# Patient Record
Sex: Female | Born: 1980 | Race: White | Hispanic: No | Smoking: Current every day smoker
Health system: Southern US, Community
[De-identification: ages and names within clinical notes are randomized; demographics above are authoritative.]

## PROBLEM LIST (undated history)

## (undated) DIAGNOSIS — F329 Major depressive disorder, single episode, unspecified: Secondary | ICD-10-CM

## (undated) DIAGNOSIS — F4310 Post-traumatic stress disorder, unspecified: Secondary | ICD-10-CM

## (undated) DIAGNOSIS — K449 Diaphragmatic hernia without obstruction or gangrene: Secondary | ICD-10-CM

## (undated) DIAGNOSIS — IMO0002 Reserved for concepts with insufficient information to code with codable children: Secondary | ICD-10-CM

## (undated) DIAGNOSIS — O039 Complete or unspecified spontaneous abortion without complication: Secondary | ICD-10-CM

## (undated) DIAGNOSIS — L309 Dermatitis, unspecified: Secondary | ICD-10-CM

## (undated) DIAGNOSIS — F419 Anxiety disorder, unspecified: Secondary | ICD-10-CM

## (undated) DIAGNOSIS — K219 Gastro-esophageal reflux disease without esophagitis: Secondary | ICD-10-CM

## (undated) DIAGNOSIS — F32A Depression, unspecified: Secondary | ICD-10-CM

## (undated) HISTORY — DX: Depression, unspecified: F32.A

## (undated) HISTORY — PX: TUBAL LIGATION: SHX77

## (undated) HISTORY — PX: APPENDECTOMY: SHX54

## (undated) HISTORY — DX: Complete or unspecified spontaneous abortion without complication: O03.9

## (undated) HISTORY — DX: Diaphragmatic hernia without obstruction or gangrene: K44.9

## (undated) HISTORY — DX: Reserved for concepts with insufficient information to code with codable children: IMO0002

## (undated) HISTORY — DX: Anxiety disorder, unspecified: F41.9

## (undated) HISTORY — DX: Gastro-esophageal reflux disease without esophagitis: K21.9

## (undated) HISTORY — DX: Major depressive disorder, single episode, unspecified: F32.9

## (undated) HISTORY — DX: Dermatitis, unspecified: L30.9

---
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
## 2002-08-16 ENCOUNTER — Other Ambulatory Visit: Admission: RE | Admit: 2002-08-16 | Discharge: 2002-08-16 | Payer: Self-pay | Admitting: Obstetrics and Gynecology

## 2002-09-15 ENCOUNTER — Inpatient Hospital Stay (HOSPITAL_COMMUNITY): Admission: AD | Admit: 2002-09-15 | Discharge: 2002-09-15 | Payer: Self-pay | Admitting: Obstetrics and Gynecology

## 2002-09-16 ENCOUNTER — Observation Stay (HOSPITAL_COMMUNITY): Admission: AD | Admit: 2002-09-16 | Discharge: 2002-09-16 | Payer: Self-pay | Admitting: Obstetrics and Gynecology

## 2002-11-10 ENCOUNTER — Encounter: Payer: Self-pay | Admitting: Obstetrics and Gynecology

## 2002-11-10 ENCOUNTER — Ambulatory Visit (HOSPITAL_COMMUNITY): Admission: RE | Admit: 2002-11-10 | Discharge: 2002-11-10 | Payer: Self-pay | Admitting: Obstetrics and Gynecology

## 2003-01-30 ENCOUNTER — Inpatient Hospital Stay (HOSPITAL_COMMUNITY): Admission: AD | Admit: 2003-01-30 | Discharge: 2003-01-30 | Payer: Self-pay | Admitting: Obstetrics and Gynecology

## 2003-02-06 ENCOUNTER — Inpatient Hospital Stay (HOSPITAL_COMMUNITY): Admission: AD | Admit: 2003-02-06 | Discharge: 2003-02-06 | Payer: Self-pay | Admitting: Obstetrics and Gynecology

## 2003-02-25 ENCOUNTER — Inpatient Hospital Stay (HOSPITAL_COMMUNITY): Admission: AD | Admit: 2003-02-25 | Discharge: 2003-02-25 | Payer: Self-pay | Admitting: Obstetrics and Gynecology

## 2003-03-14 ENCOUNTER — Inpatient Hospital Stay (HOSPITAL_COMMUNITY): Admission: AD | Admit: 2003-03-14 | Discharge: 2003-03-14 | Payer: Self-pay | Admitting: Obstetrics and Gynecology

## 2003-04-02 ENCOUNTER — Inpatient Hospital Stay (HOSPITAL_COMMUNITY): Admission: AD | Admit: 2003-04-02 | Discharge: 2003-04-04 | Payer: Self-pay | Admitting: Obstetrics and Gynecology

## 2003-06-13 ENCOUNTER — Inpatient Hospital Stay (HOSPITAL_COMMUNITY): Admission: AD | Admit: 2003-06-13 | Discharge: 2003-06-13 | Payer: Self-pay | Admitting: Obstetrics and Gynecology

## 2003-06-23 ENCOUNTER — Encounter: Payer: Self-pay | Admitting: Family Medicine

## 2003-06-23 ENCOUNTER — Encounter: Admission: RE | Admit: 2003-06-23 | Discharge: 2003-06-23 | Payer: Self-pay | Admitting: Family Medicine

## 2003-09-21 ENCOUNTER — Emergency Department (HOSPITAL_COMMUNITY): Admission: EM | Admit: 2003-09-21 | Discharge: 2003-09-21 | Payer: Self-pay | Admitting: Emergency Medicine

## 2003-11-12 ENCOUNTER — Emergency Department (HOSPITAL_COMMUNITY): Admission: EM | Admit: 2003-11-12 | Discharge: 2003-11-12 | Payer: Self-pay | Admitting: Emergency Medicine

## 2003-11-14 ENCOUNTER — Other Ambulatory Visit: Admission: RE | Admit: 2003-11-14 | Discharge: 2003-11-14 | Payer: Self-pay | Admitting: *Deleted

## 2003-12-08 ENCOUNTER — Emergency Department (HOSPITAL_COMMUNITY): Admission: EM | Admit: 2003-12-08 | Discharge: 2003-12-08 | Payer: Self-pay | Admitting: Emergency Medicine

## 2003-12-13 ENCOUNTER — Encounter: Admission: RE | Admit: 2003-12-13 | Discharge: 2003-12-13 | Payer: Self-pay | Admitting: *Deleted

## 2004-09-18 ENCOUNTER — Emergency Department (HOSPITAL_COMMUNITY): Admission: EM | Admit: 2004-09-18 | Discharge: 2004-09-18 | Payer: Self-pay | Admitting: Emergency Medicine

## 2005-04-09 ENCOUNTER — Emergency Department (HOSPITAL_COMMUNITY): Admission: EM | Admit: 2005-04-09 | Discharge: 2005-04-09 | Payer: Self-pay | Admitting: Emergency Medicine

## 2005-06-22 ENCOUNTER — Emergency Department (HOSPITAL_COMMUNITY): Admission: EM | Admit: 2005-06-22 | Discharge: 2005-06-22 | Payer: Self-pay | Admitting: Family Medicine

## 2005-08-03 ENCOUNTER — Emergency Department (HOSPITAL_COMMUNITY): Admission: EM | Admit: 2005-08-03 | Discharge: 2005-08-03 | Payer: Self-pay | Admitting: Family Medicine

## 2005-12-11 ENCOUNTER — Emergency Department (HOSPITAL_COMMUNITY): Admission: EM | Admit: 2005-12-11 | Discharge: 2005-12-11 | Payer: Self-pay | Admitting: Emergency Medicine

## 2006-08-05 ENCOUNTER — Emergency Department (HOSPITAL_COMMUNITY): Admission: EM | Admit: 2006-08-05 | Discharge: 2006-08-05 | Payer: Self-pay | Admitting: Emergency Medicine

## 2008-09-05 ENCOUNTER — Ambulatory Visit: Payer: Self-pay | Admitting: Occupational Medicine

## 2008-09-09 ENCOUNTER — Ambulatory Visit: Payer: Self-pay | Admitting: Physician Assistant

## 2008-09-09 ENCOUNTER — Encounter: Admission: RE | Admit: 2008-09-09 | Discharge: 2008-09-09 | Payer: Self-pay | Admitting: Obstetrics & Gynecology

## 2008-09-10 ENCOUNTER — Encounter: Payer: Self-pay | Admitting: Physician Assistant

## 2008-09-12 ENCOUNTER — Encounter: Admission: RE | Admit: 2008-09-12 | Discharge: 2008-09-12 | Payer: Self-pay | Admitting: Obstetrics & Gynecology

## 2008-09-14 ENCOUNTER — Ambulatory Visit: Payer: Self-pay | Admitting: Obstetrics & Gynecology

## 2008-09-14 LAB — CONVERTED CEMR LAB
HCT: 37.5 % (ref 36.0–46.0)
Hemoglobin: 12.6 g/dL (ref 12.0–15.0)
MCV: 90.5 fL (ref 78.0–100.0)
RBC: 4.14 M/uL (ref 3.87–5.11)
WBC: 5.2 10*3/uL (ref 4.0–10.5)

## 2008-09-16 ENCOUNTER — Ambulatory Visit: Payer: Self-pay | Admitting: Family

## 2008-10-07 ENCOUNTER — Ambulatory Visit: Payer: Self-pay | Admitting: Physician Assistant

## 2008-12-28 ENCOUNTER — Encounter: Payer: Self-pay | Admitting: Physician Assistant

## 2008-12-28 ENCOUNTER — Ambulatory Visit: Payer: Self-pay | Admitting: Obstetrics & Gynecology

## 2009-01-13 ENCOUNTER — Ambulatory Visit: Payer: Self-pay | Admitting: Family Medicine

## 2009-01-13 DIAGNOSIS — M940 Chondrocostal junction syndrome [Tietze]: Secondary | ICD-10-CM | POA: Insufficient documentation

## 2009-01-13 DIAGNOSIS — L259 Unspecified contact dermatitis, unspecified cause: Secondary | ICD-10-CM | POA: Insufficient documentation

## 2009-01-13 DIAGNOSIS — F411 Generalized anxiety disorder: Secondary | ICD-10-CM | POA: Insufficient documentation

## 2009-01-13 DIAGNOSIS — F329 Major depressive disorder, single episode, unspecified: Secondary | ICD-10-CM

## 2009-01-13 DIAGNOSIS — F3289 Other specified depressive episodes: Secondary | ICD-10-CM | POA: Insufficient documentation

## 2009-01-15 ENCOUNTER — Encounter: Payer: Self-pay | Admitting: Family Medicine

## 2009-01-20 ENCOUNTER — Ambulatory Visit: Payer: Self-pay | Admitting: Family Medicine

## 2009-01-20 DIAGNOSIS — N390 Urinary tract infection, site not specified: Secondary | ICD-10-CM | POA: Insufficient documentation

## 2009-01-20 DIAGNOSIS — R197 Diarrhea, unspecified: Secondary | ICD-10-CM | POA: Insufficient documentation

## 2009-01-20 DIAGNOSIS — R112 Nausea with vomiting, unspecified: Secondary | ICD-10-CM | POA: Insufficient documentation

## 2009-01-20 LAB — CONVERTED CEMR LAB
Bilirubin Urine: NEGATIVE
Ketones, urine, test strip: NEGATIVE
Protein, U semiquant: NEGATIVE

## 2009-01-27 ENCOUNTER — Encounter: Payer: Self-pay | Admitting: Obstetrics and Gynecology

## 2009-01-27 ENCOUNTER — Ambulatory Visit: Payer: Self-pay | Admitting: Obstetrics & Gynecology

## 2009-01-27 LAB — CONVERTED CEMR LAB: hCG, Beta Chain, Quant, S: 22.8 milliintl units/mL

## 2009-02-03 ENCOUNTER — Emergency Department (HOSPITAL_BASED_OUTPATIENT_CLINIC_OR_DEPARTMENT_OTHER): Admission: EM | Admit: 2009-02-03 | Discharge: 2009-02-03 | Payer: Self-pay | Admitting: Emergency Medicine

## 2009-02-04 ENCOUNTER — Inpatient Hospital Stay (HOSPITAL_COMMUNITY): Admission: AD | Admit: 2009-02-04 | Discharge: 2009-02-04 | Payer: Self-pay | Admitting: Family Medicine

## 2009-02-04 ENCOUNTER — Ambulatory Visit: Payer: Self-pay | Admitting: Obstetrics and Gynecology

## 2009-02-13 ENCOUNTER — Ambulatory Visit: Payer: Self-pay | Admitting: Family

## 2009-02-20 ENCOUNTER — Ambulatory Visit: Payer: Self-pay | Admitting: Family

## 2009-02-20 ENCOUNTER — Encounter: Payer: Self-pay | Admitting: Family

## 2009-02-20 ENCOUNTER — Other Ambulatory Visit: Admission: RE | Admit: 2009-02-20 | Discharge: 2009-02-20 | Payer: Self-pay | Admitting: Family

## 2009-02-21 ENCOUNTER — Encounter: Payer: Self-pay | Admitting: Family

## 2009-02-21 LAB — CONVERTED CEMR LAB
Basophils Absolute: 0 10*3/uL (ref 0.0–0.1)
Hemoglobin: 11.8 g/dL — ABNORMAL LOW (ref 12.0–15.0)
Hepatitis B Surface Ag: NEGATIVE
Lymphocytes Relative: 32 % (ref 12–46)
Monocytes Absolute: 1 10*3/uL (ref 0.1–1.0)
Neutro Abs: 4.4 10*3/uL (ref 1.7–7.7)
RBC: 3.84 M/uL — ABNORMAL LOW (ref 3.87–5.11)
RDW: 15.1 % (ref 11.5–15.5)

## 2009-02-27 ENCOUNTER — Ambulatory Visit: Payer: Self-pay | Admitting: Obstetrics and Gynecology

## 2009-02-28 ENCOUNTER — Encounter: Payer: Self-pay | Admitting: Obstetrics and Gynecology

## 2009-03-01 ENCOUNTER — Ambulatory Visit: Payer: Self-pay | Admitting: Obstetrics & Gynecology

## 2009-03-01 LAB — CONVERTED CEMR LAB
Albumin: 4.4 g/dL (ref 3.5–5.2)
BUN: 9 mg/dL (ref 6–23)
CO2: 21 meq/L (ref 19–32)
Calcium: 9.4 mg/dL (ref 8.4–10.5)
Chloride: 104 meq/L (ref 96–112)
Glucose, Bld: 77 mg/dL (ref 70–99)
Potassium: 4 meq/L (ref 3.5–5.3)

## 2009-03-10 ENCOUNTER — Ambulatory Visit: Payer: Self-pay | Admitting: Obstetrics and Gynecology

## 2009-03-12 ENCOUNTER — Inpatient Hospital Stay (HOSPITAL_COMMUNITY): Admission: AD | Admit: 2009-03-12 | Discharge: 2009-03-12 | Payer: Self-pay | Admitting: Obstetrics and Gynecology

## 2009-03-15 ENCOUNTER — Ambulatory Visit: Payer: Self-pay | Admitting: Obstetrics & Gynecology

## 2009-03-27 ENCOUNTER — Ambulatory Visit: Payer: Self-pay | Admitting: Family

## 2009-03-28 ENCOUNTER — Ambulatory Visit (HOSPITAL_COMMUNITY): Admission: RE | Admit: 2009-03-28 | Discharge: 2009-03-28 | Payer: Self-pay | Admitting: Obstetrics & Gynecology

## 2009-03-28 ENCOUNTER — Encounter: Payer: Self-pay | Admitting: Family

## 2009-04-17 ENCOUNTER — Ambulatory Visit: Payer: Self-pay | Admitting: Family

## 2009-04-18 ENCOUNTER — Encounter: Payer: Self-pay | Admitting: Family

## 2009-04-18 LAB — CONVERTED CEMR LAB
Chlamydia, DNA Probe: NEGATIVE
GC Probe Amp, Genital: NEGATIVE
Yeast Wet Prep HPF POC: NONE SEEN

## 2009-04-24 ENCOUNTER — Ambulatory Visit: Payer: Self-pay | Admitting: Physician Assistant

## 2009-04-26 ENCOUNTER — Ambulatory Visit: Payer: Self-pay | Admitting: Obstetrics and Gynecology

## 2009-04-26 ENCOUNTER — Inpatient Hospital Stay (HOSPITAL_COMMUNITY): Admission: AD | Admit: 2009-04-26 | Discharge: 2009-04-26 | Payer: Self-pay | Admitting: Obstetrics & Gynecology

## 2009-05-09 ENCOUNTER — Ambulatory Visit (HOSPITAL_COMMUNITY): Admission: RE | Admit: 2009-05-09 | Discharge: 2009-05-09 | Payer: Self-pay | Admitting: Obstetrics & Gynecology

## 2009-05-28 ENCOUNTER — Ambulatory Visit: Payer: Self-pay | Admitting: Obstetrics and Gynecology

## 2009-05-28 ENCOUNTER — Inpatient Hospital Stay (HOSPITAL_COMMUNITY): Admission: AD | Admit: 2009-05-28 | Discharge: 2009-05-29 | Payer: Self-pay | Admitting: Obstetrics & Gynecology

## 2009-05-29 ENCOUNTER — Ambulatory Visit: Payer: Self-pay | Admitting: Physician Assistant

## 2009-06-19 ENCOUNTER — Ambulatory Visit (HOSPITAL_COMMUNITY): Admission: RE | Admit: 2009-06-19 | Discharge: 2009-06-19 | Payer: Self-pay | Admitting: Obstetrics & Gynecology

## 2009-06-26 ENCOUNTER — Ambulatory Visit: Payer: Self-pay | Admitting: Family

## 2009-06-28 ENCOUNTER — Encounter: Payer: Self-pay | Admitting: Family

## 2009-06-28 LAB — CONVERTED CEMR LAB
HCT: 33.6 % — ABNORMAL LOW (ref 36.0–46.0)
Hemoglobin: 10.7 g/dL — ABNORMAL LOW (ref 12.0–15.0)
MCV: 94.6 fL (ref 78.0–100.0)
Platelets: 248 10*3/uL (ref 150–400)
RDW: 14 % (ref 11.5–15.5)

## 2009-07-04 ENCOUNTER — Ambulatory Visit: Payer: Self-pay | Admitting: Obstetrics and Gynecology

## 2009-07-04 ENCOUNTER — Inpatient Hospital Stay (HOSPITAL_COMMUNITY): Admission: AD | Admit: 2009-07-04 | Discharge: 2009-07-04 | Payer: Self-pay | Admitting: Obstetrics & Gynecology

## 2009-07-24 ENCOUNTER — Ambulatory Visit: Payer: Self-pay | Admitting: Physician Assistant

## 2009-07-31 ENCOUNTER — Inpatient Hospital Stay (HOSPITAL_COMMUNITY): Admission: AD | Admit: 2009-07-31 | Discharge: 2009-07-31 | Payer: Self-pay | Admitting: Obstetrics and Gynecology

## 2009-08-01 ENCOUNTER — Ambulatory Visit (HOSPITAL_COMMUNITY): Admission: RE | Admit: 2009-08-01 | Discharge: 2009-08-01 | Payer: Self-pay | Admitting: Gastroenterology

## 2009-08-09 ENCOUNTER — Encounter: Payer: Self-pay | Admitting: Cardiology

## 2009-08-09 ENCOUNTER — Ambulatory Visit: Payer: Self-pay | Admitting: Cardiology

## 2009-08-09 DIAGNOSIS — R072 Precordial pain: Secondary | ICD-10-CM | POA: Insufficient documentation

## 2009-08-09 DIAGNOSIS — R002 Palpitations: Secondary | ICD-10-CM | POA: Insufficient documentation

## 2009-08-11 ENCOUNTER — Ambulatory Visit: Payer: Self-pay | Admitting: Obstetrics and Gynecology

## 2009-08-14 ENCOUNTER — Ambulatory Visit: Payer: Self-pay | Admitting: Obstetrics and Gynecology

## 2009-08-15 ENCOUNTER — Encounter: Payer: Self-pay | Admitting: Obstetrics and Gynecology

## 2009-08-25 ENCOUNTER — Ambulatory Visit: Payer: Self-pay | Admitting: Physician Assistant

## 2009-09-04 ENCOUNTER — Ambulatory Visit: Payer: Self-pay | Admitting: Obstetrics and Gynecology

## 2009-09-04 ENCOUNTER — Inpatient Hospital Stay (HOSPITAL_COMMUNITY): Admission: AD | Admit: 2009-09-04 | Discharge: 2009-09-04 | Payer: Self-pay | Admitting: Obstetrics and Gynecology

## 2009-09-13 ENCOUNTER — Ambulatory Visit: Payer: Self-pay | Admitting: Obstetrics & Gynecology

## 2009-09-18 ENCOUNTER — Ambulatory Visit: Payer: Self-pay | Admitting: Family

## 2009-09-21 ENCOUNTER — Encounter (INDEPENDENT_AMBULATORY_CARE_PROVIDER_SITE_OTHER): Payer: Self-pay | Admitting: *Deleted

## 2009-09-22 ENCOUNTER — Inpatient Hospital Stay (HOSPITAL_COMMUNITY): Admission: AD | Admit: 2009-09-22 | Discharge: 2009-09-22 | Payer: Self-pay | Admitting: Obstetrics & Gynecology

## 2009-09-22 ENCOUNTER — Ambulatory Visit: Payer: Self-pay | Admitting: Obstetrics and Gynecology

## 2009-09-25 ENCOUNTER — Ambulatory Visit: Payer: Self-pay | Admitting: Obstetrics and Gynecology

## 2009-09-27 ENCOUNTER — Ambulatory Visit: Payer: Self-pay | Admitting: Advanced Practice Midwife

## 2009-09-27 ENCOUNTER — Inpatient Hospital Stay (HOSPITAL_COMMUNITY): Admission: AD | Admit: 2009-09-27 | Discharge: 2009-09-27 | Payer: Self-pay | Admitting: Obstetrics and Gynecology

## 2009-09-27 ENCOUNTER — Ambulatory Visit: Payer: Self-pay | Admitting: Obstetrics & Gynecology

## 2009-10-01 ENCOUNTER — Inpatient Hospital Stay (HOSPITAL_COMMUNITY): Admission: AD | Admit: 2009-10-01 | Discharge: 2009-10-01 | Payer: Self-pay | Admitting: Obstetrics and Gynecology

## 2009-10-02 ENCOUNTER — Ambulatory Visit: Payer: Self-pay | Admitting: Advanced Practice Midwife

## 2009-10-04 ENCOUNTER — Inpatient Hospital Stay (HOSPITAL_COMMUNITY): Admission: AD | Admit: 2009-10-04 | Discharge: 2009-10-04 | Payer: Self-pay | Admitting: Obstetrics and Gynecology

## 2009-10-04 ENCOUNTER — Ambulatory Visit: Payer: Self-pay | Admitting: Obstetrics & Gynecology

## 2009-10-04 ENCOUNTER — Ambulatory Visit: Payer: Self-pay | Admitting: Physician Assistant

## 2009-10-05 ENCOUNTER — Ambulatory Visit: Payer: Self-pay | Admitting: Family

## 2009-10-05 ENCOUNTER — Inpatient Hospital Stay (HOSPITAL_COMMUNITY): Admission: AD | Admit: 2009-10-05 | Discharge: 2009-10-07 | Payer: Self-pay | Admitting: Obstetrics & Gynecology

## 2009-10-16 ENCOUNTER — Ambulatory Visit: Payer: Self-pay | Admitting: Family

## 2009-11-12 IMAGING — US US OB DETAIL+14 WK
1 series · 18 of 28 positions shown · non-contrast
Comparison: none

OBSTETRICAL ULTRASOUND:
 This ultrasound was performed in The [HOSPITAL], and the AS OB/GYN report will be stored to [REDACTED] PACS.

[Series 1: us ob detail+14 wk · 18 of 73 slices shown]
[im 1/73]
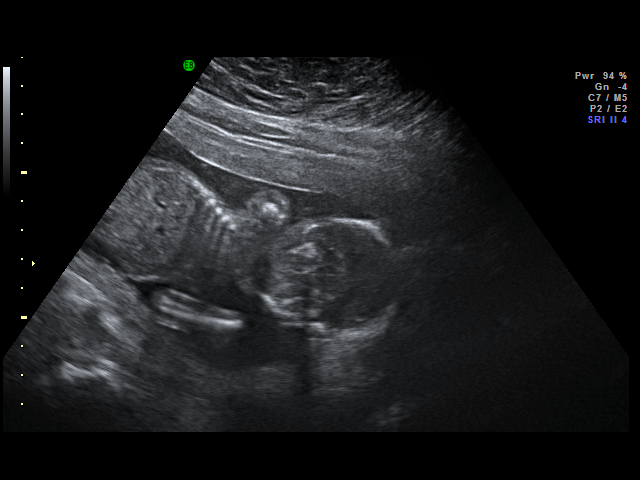
[im 6/73]
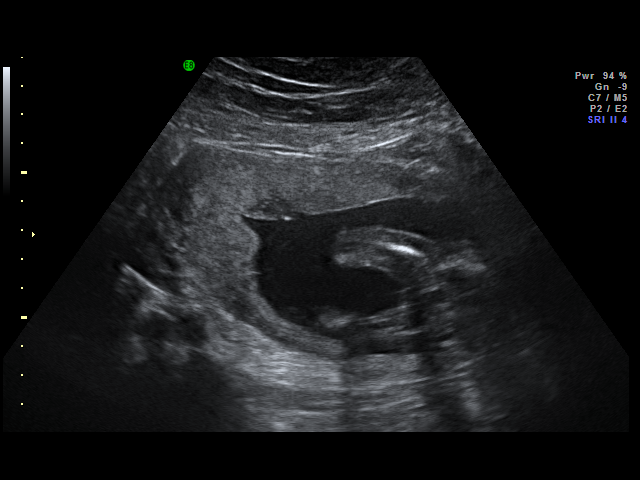
[im 9/73]
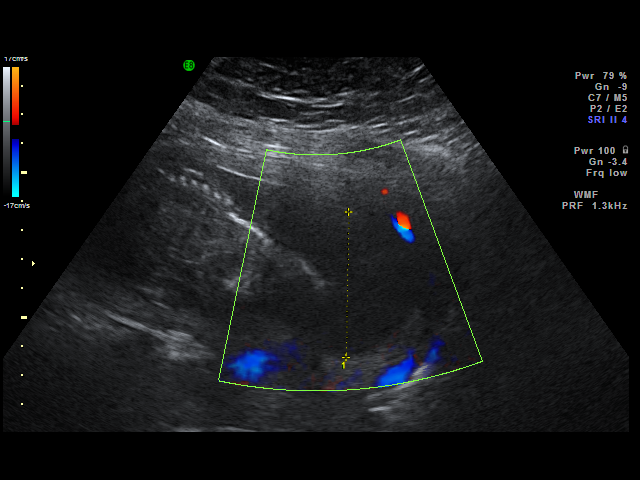
[im 14/73]
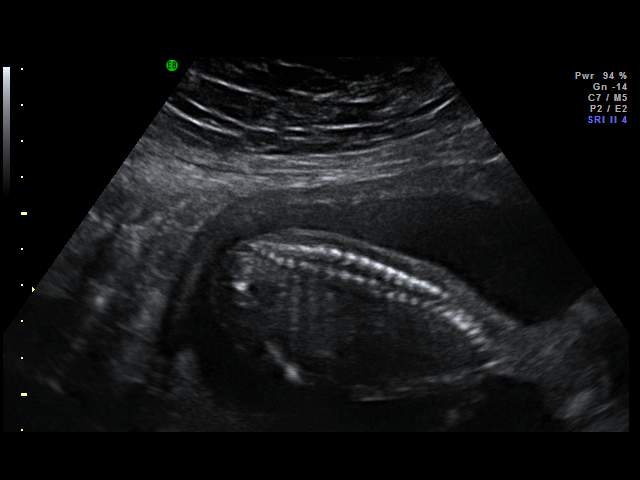
[im 19/73]
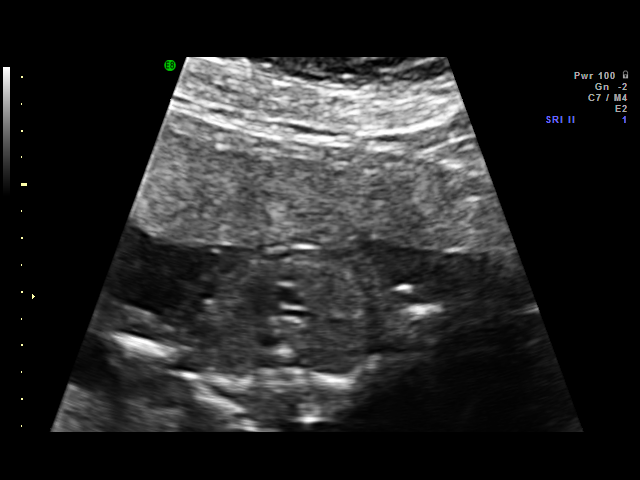
[im 22/73]
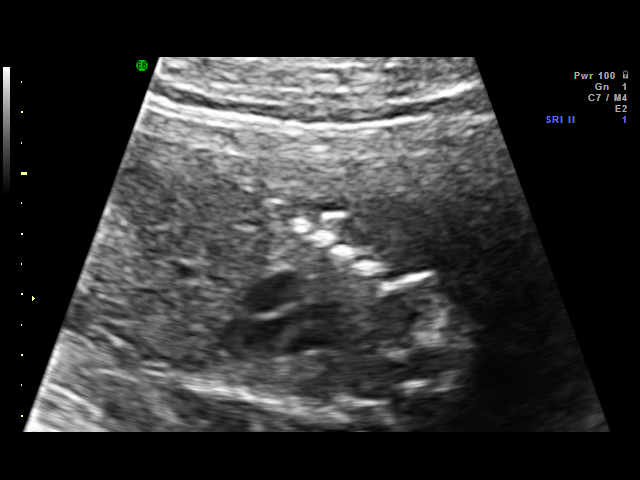
[im 27/73]
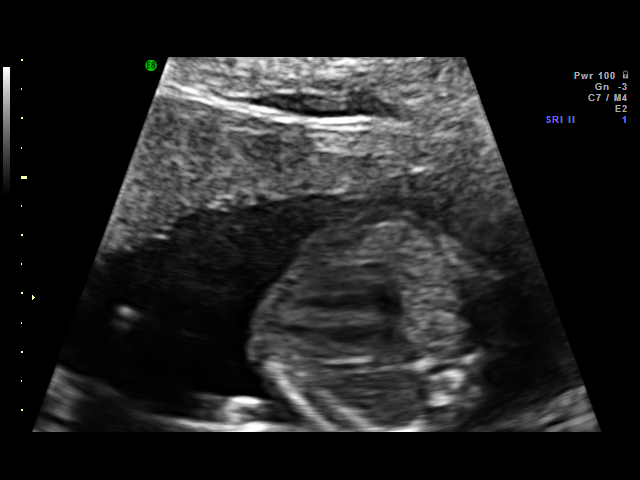
[im 30/73]
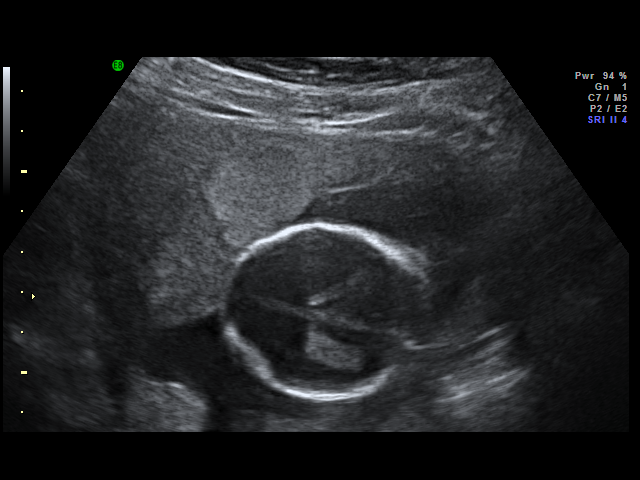
[im 35/73]
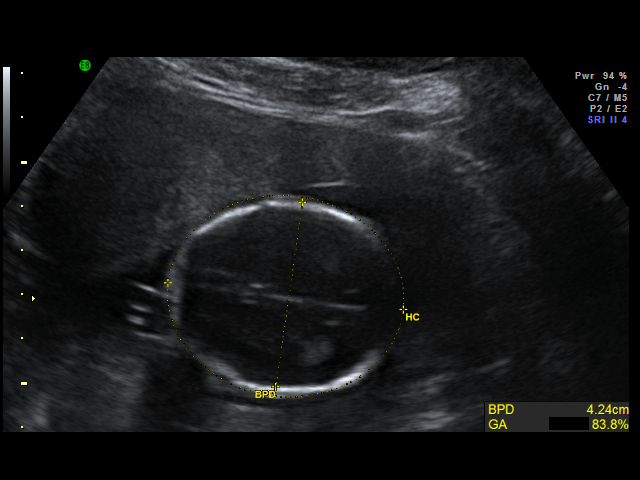
[im 38/73]
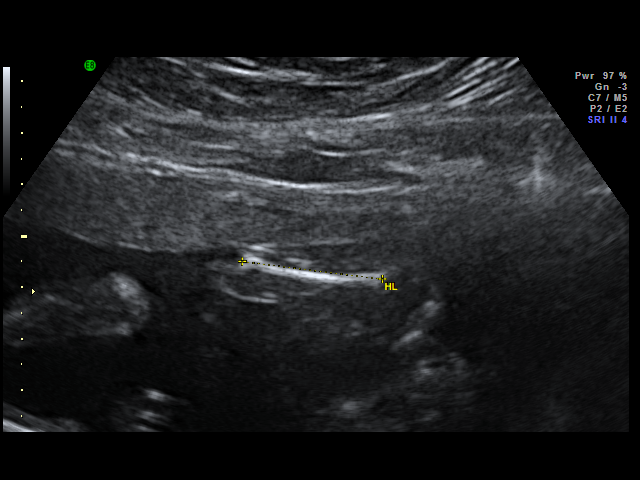
[im 43/73]
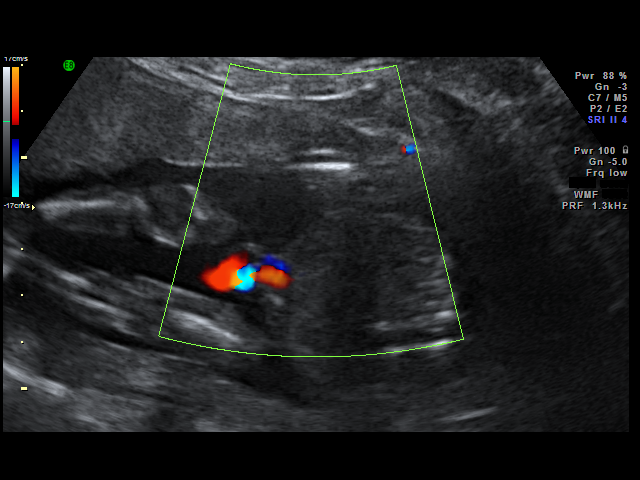
[im 46/73]
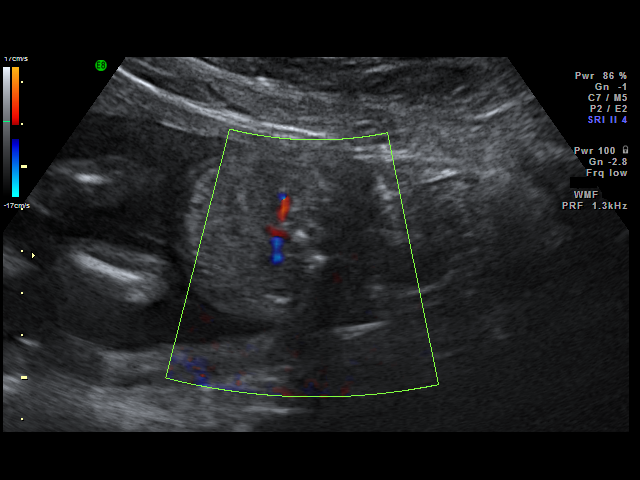
[im 51/73]
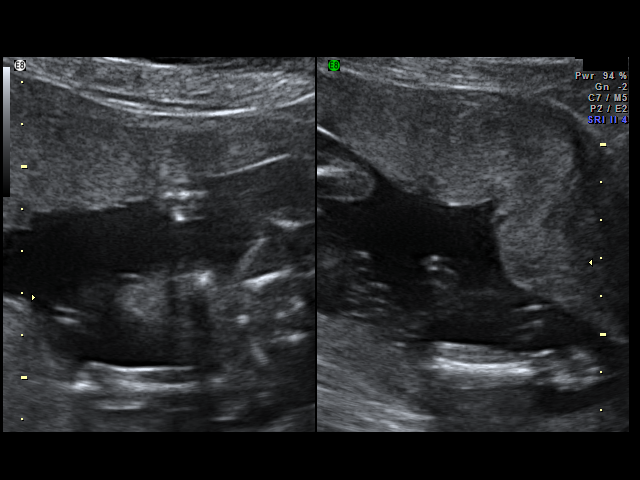
[im 57/73]
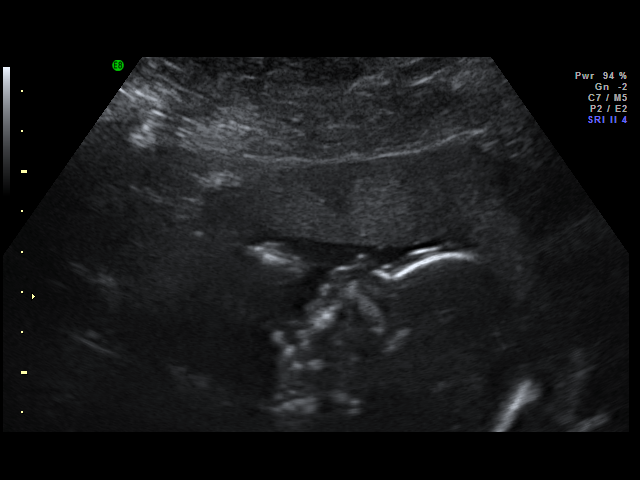
[im 59/73]
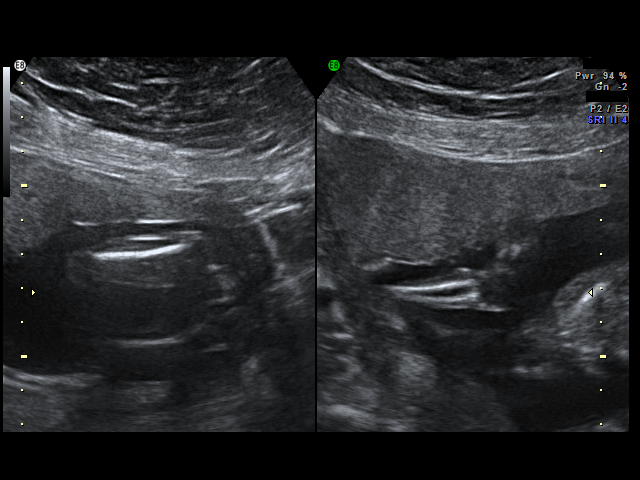
[im 65/73]
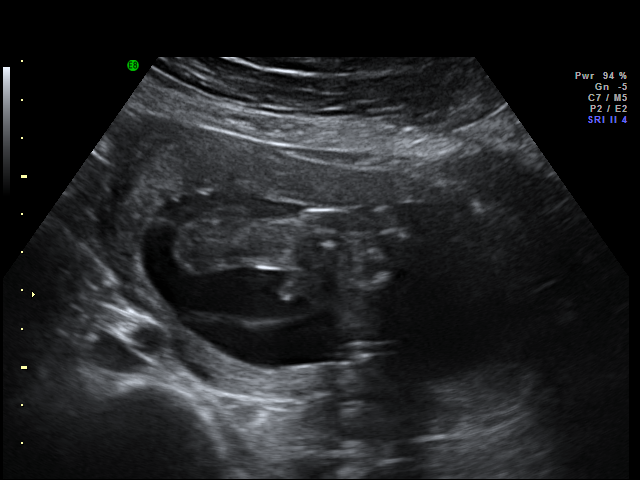
[im 67/73]
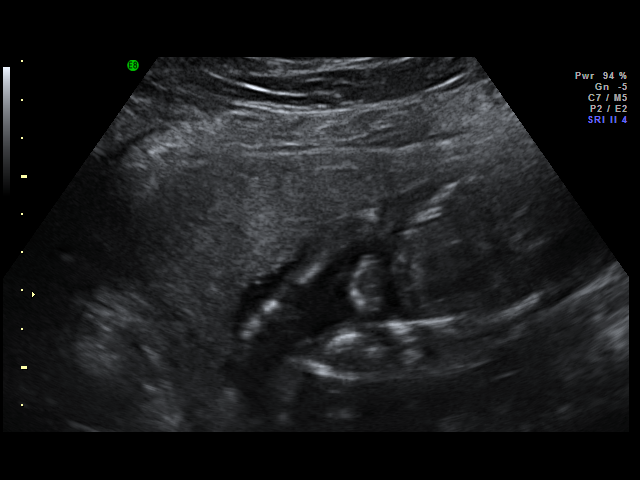
[im 73/73]
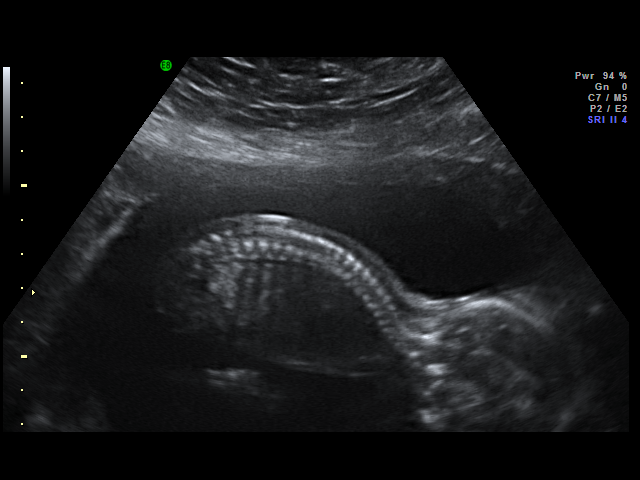

[18 of 28 positions shown; findings below may reference images not displayed]

IMPRESSION: AS OB/GYN has also been faxed to the ordering physician.

## 2010-01-09 ENCOUNTER — Ambulatory Visit: Payer: Self-pay | Admitting: Family Medicine

## 2010-01-09 DIAGNOSIS — J029 Acute pharyngitis, unspecified: Secondary | ICD-10-CM | POA: Insufficient documentation

## 2010-01-09 DIAGNOSIS — E060 Acute thyroiditis: Secondary | ICD-10-CM | POA: Insufficient documentation

## 2010-01-12 ENCOUNTER — Ambulatory Visit: Payer: Self-pay | Admitting: Family Medicine

## 2010-01-12 ENCOUNTER — Telehealth: Payer: Self-pay | Admitting: Family Medicine

## 2010-01-30 ENCOUNTER — Ambulatory Visit: Payer: Self-pay | Admitting: Family Medicine

## 2010-01-31 ENCOUNTER — Encounter: Payer: Self-pay | Admitting: Family Medicine

## 2010-01-31 LAB — CONVERTED CEMR LAB
Basophils Absolute: 0 10*3/uL (ref 0.0–0.1)
Basophils Relative: 0 % (ref 0–1)
Eosinophils Relative: 2 % (ref 0–5)
HCT: 38.6 % (ref 36.0–46.0)
Hemoglobin: 11.9 g/dL — ABNORMAL LOW (ref 12.0–15.0)
Lymphocytes Relative: 21 % (ref 12–46)
MCHC: 30.8 g/dL (ref 30.0–36.0)
Monocytes Absolute: 0.9 10*3/uL (ref 0.1–1.0)
Monocytes Relative: 10 % (ref 3–12)
RBC: 4.38 M/uL (ref 3.87–5.11)
RDW: 15.9 % — ABNORMAL HIGH (ref 11.5–15.5)

## 2010-02-15 ENCOUNTER — Telehealth: Payer: Self-pay | Admitting: Family Medicine

## 2010-03-10 IMAGING — US US FETAL BPP W/O NONSTRESS
1 series · 7 of 7 positions shown · non-contrast
Comparison: none

OBSTETRICAL ULTRASOUND:
 This ultrasound exam was performed in the [HOSPITAL] Ultrasound Department.  The OB US report was generated in the AS system, and faxed to the ordering physician.  This report is also available in [HOSPITAL]?s [employer] and in [REDACTED] PACS.

[Series 1: us fetal bpp w/o nonstress · non-contrast · 0.32mm/px · 7 acquisitions, 7 frames shown]
[im 1/7]
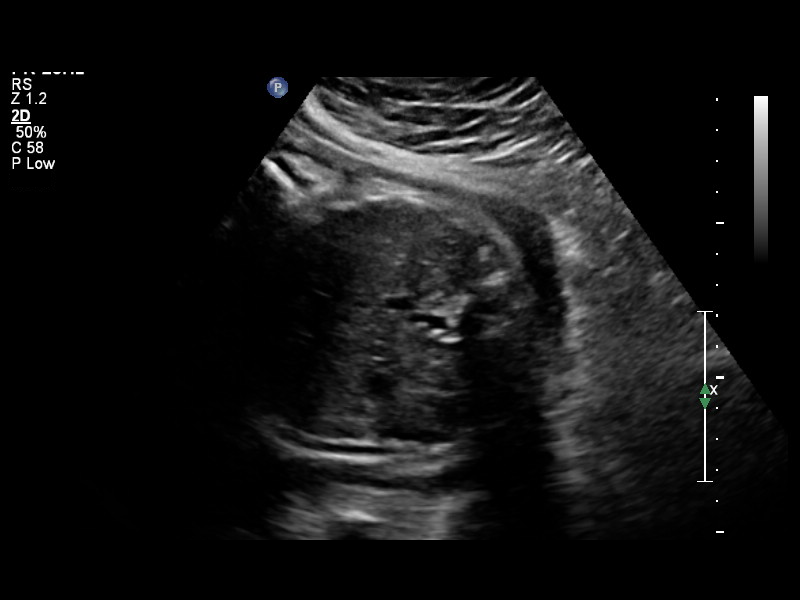
[im 2/7]
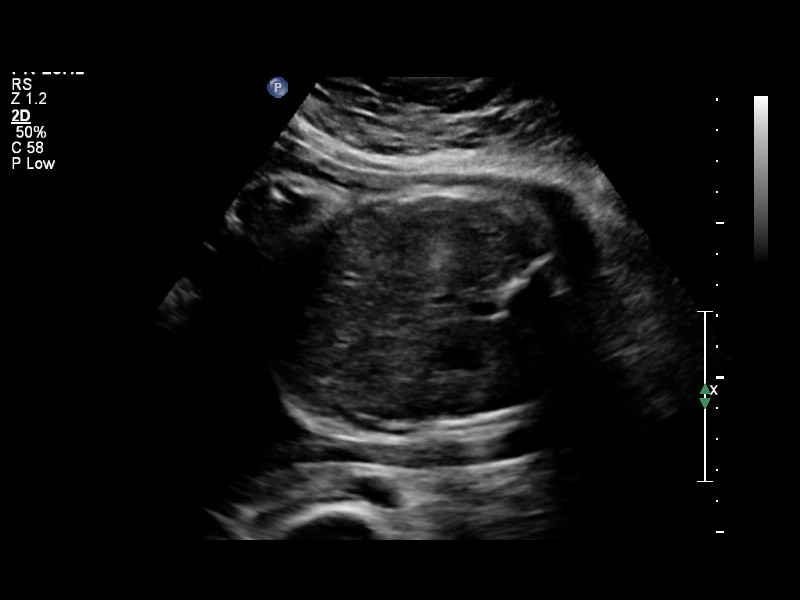
[im 3/7]
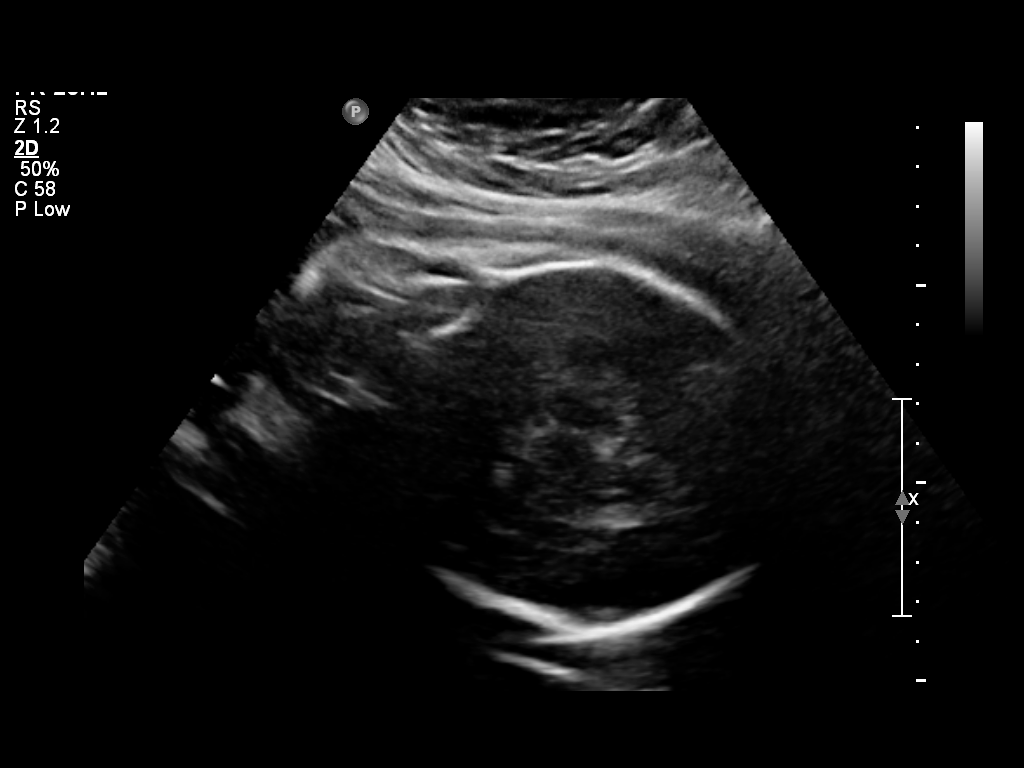
[im 4/7]
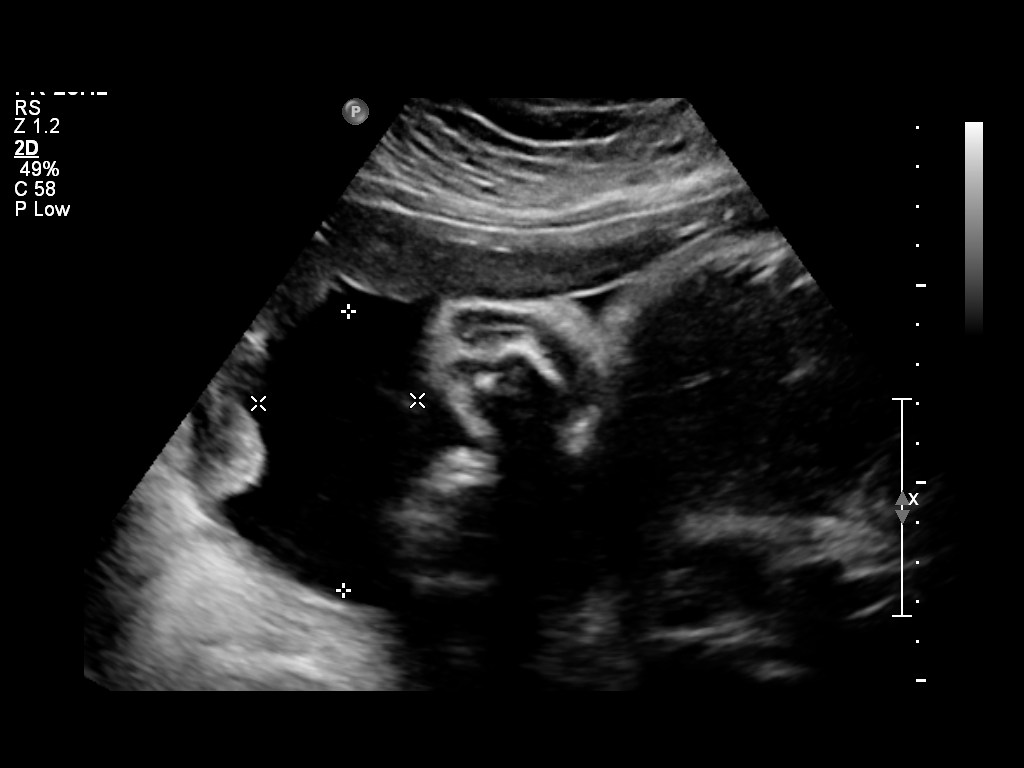
[im 5/7]
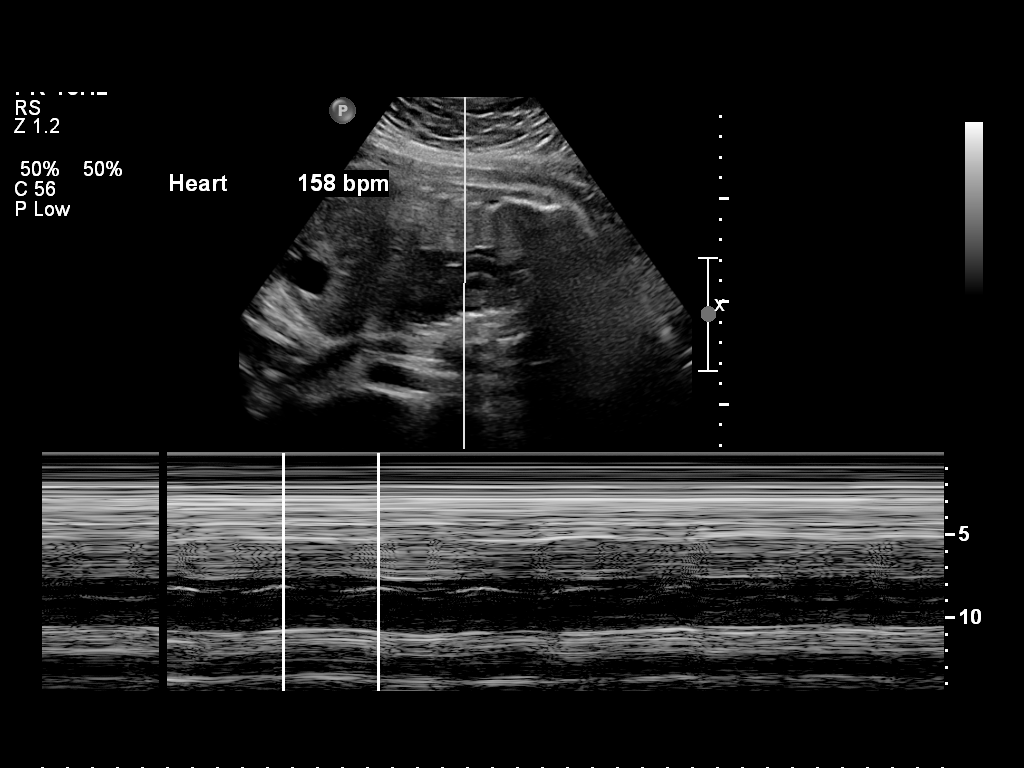
[im 6/7]
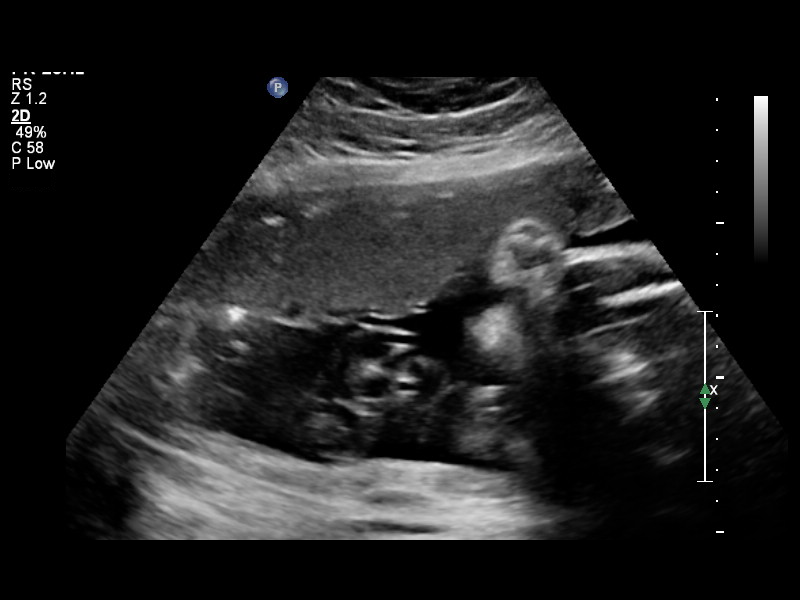
[im 7/7]
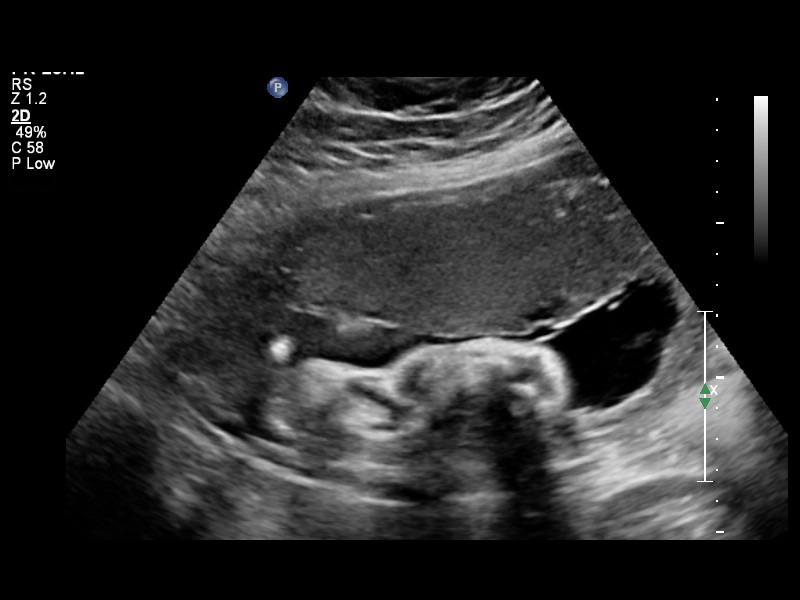

[7 of 7 positions shown; findings below may reference images not displayed]

IMPRESSION: See AS Obstetric US report.

## 2010-03-28 IMAGING — US US OB FOLLOW-UP
1 series · 14 of 28 positions shown · non-contrast
Comparison: none

OBSTETRICAL ULTRASOUND:
 This ultrasound exam was performed in the [HOSPITAL] Ultrasound Department.  The OB US report was generated in the AS system, and faxed to the ordering physician.  This report is also available in [HOSPITAL]?s [employer] and in [REDACTED] PACS.

[Series 1: us ob follow up · 14 of 29 slices shown]
[im 2/29]
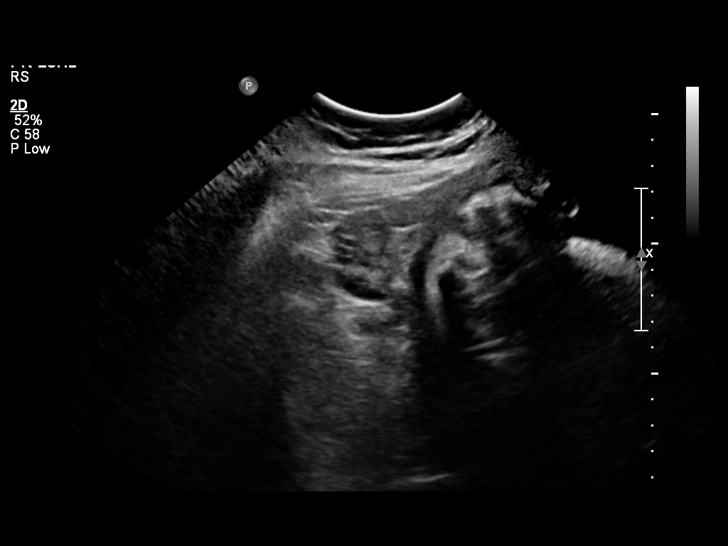
[im 4/29]
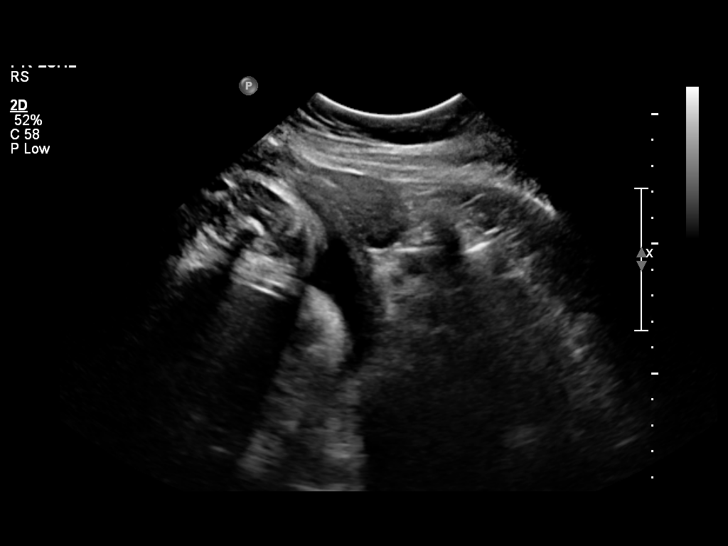
[im 6/29]
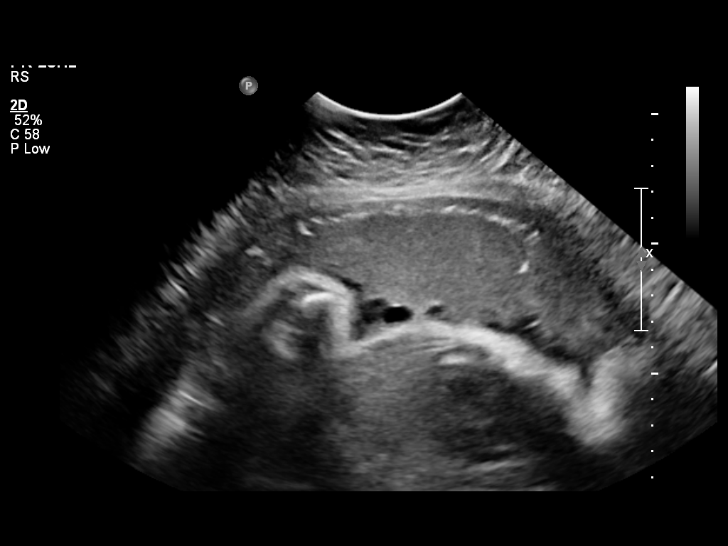
[im 8/29]
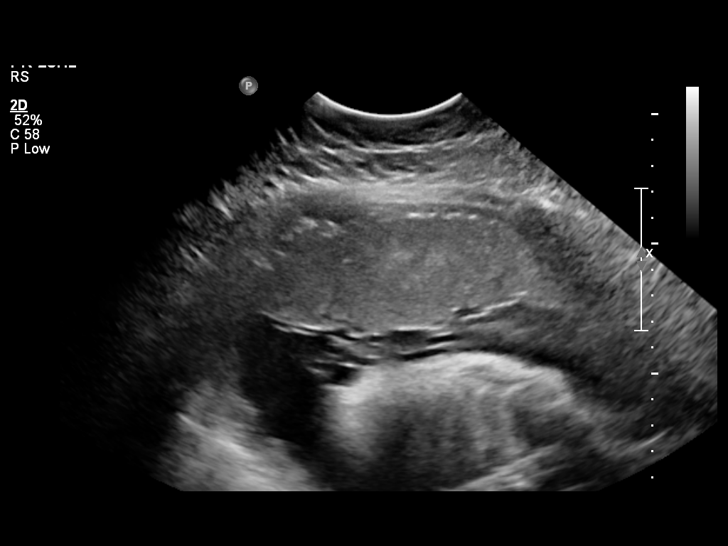
[im 10/29]
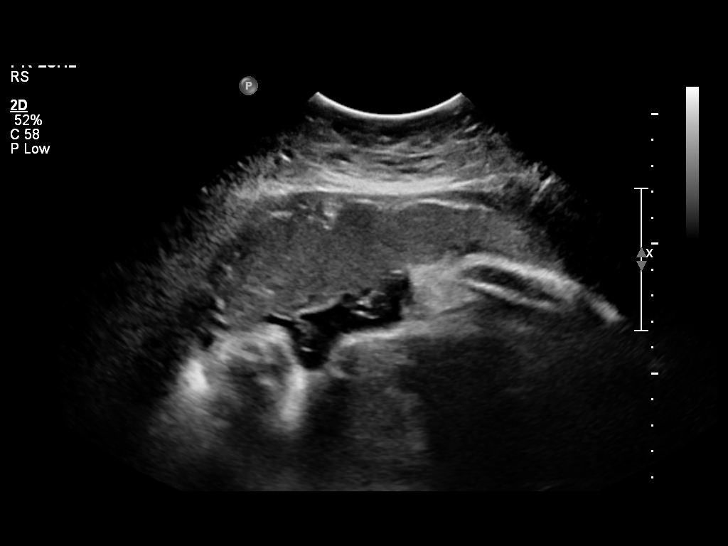
[im 12/29]
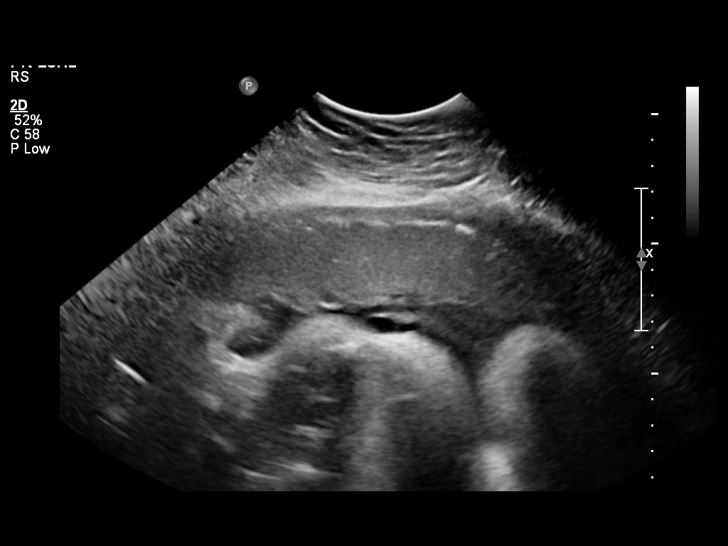
[im 14/29]
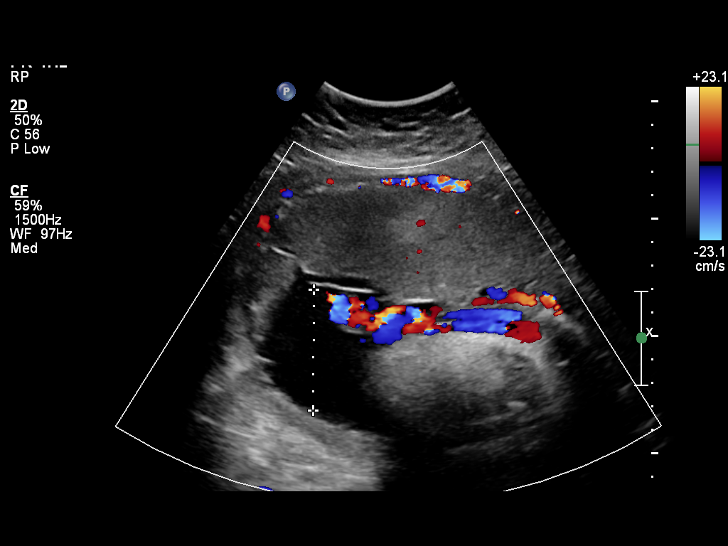
[im 16/29]
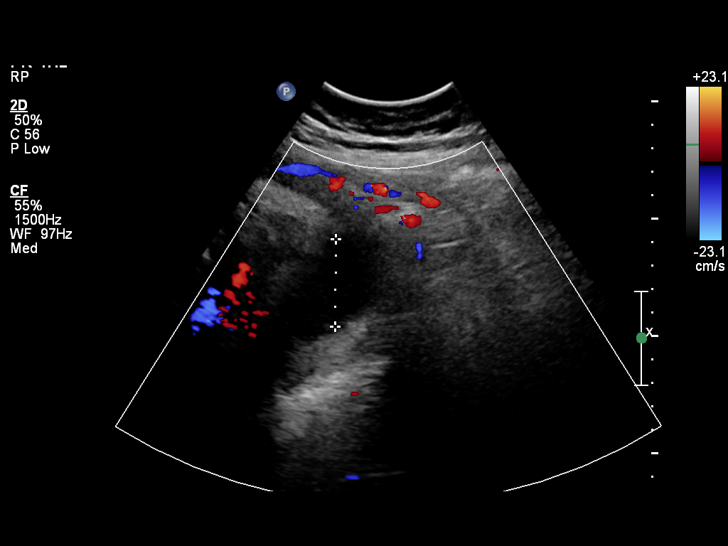
[im 18/29]
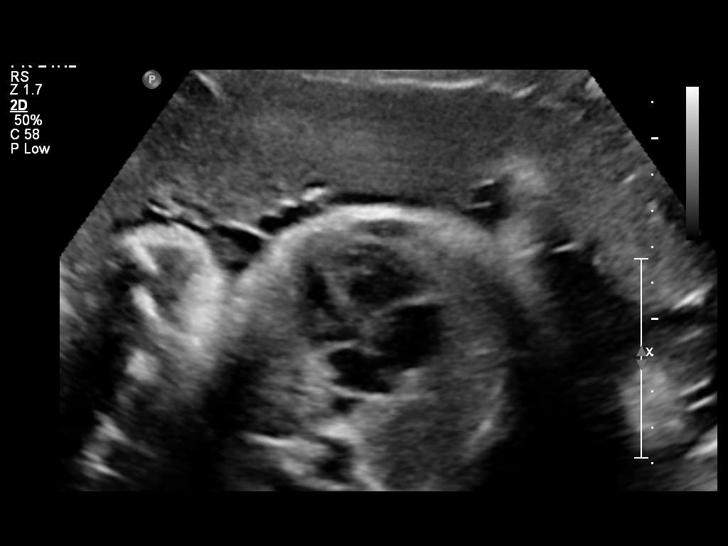
[im 20/29]
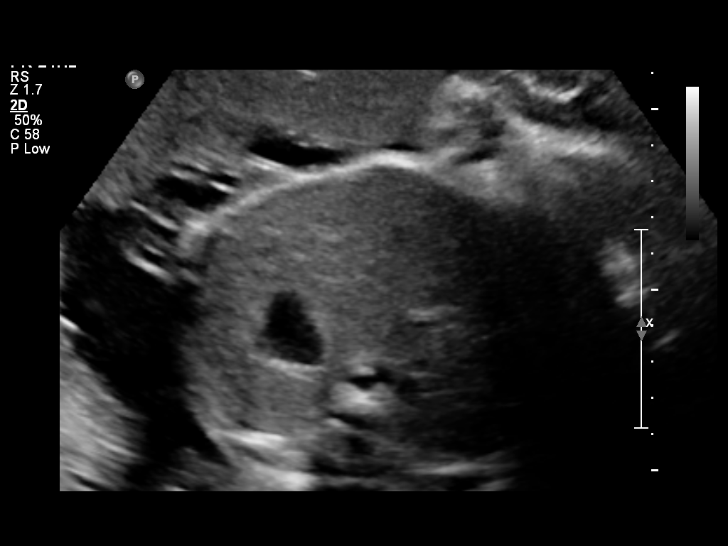
[im 22/29]
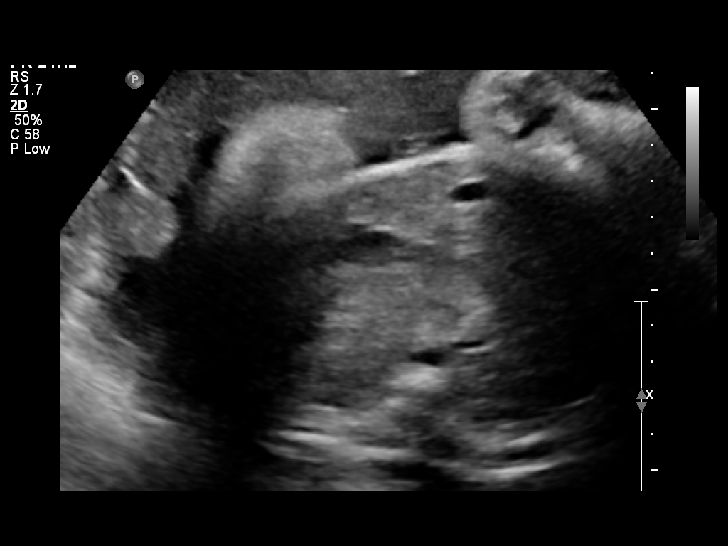
[im 24/29]
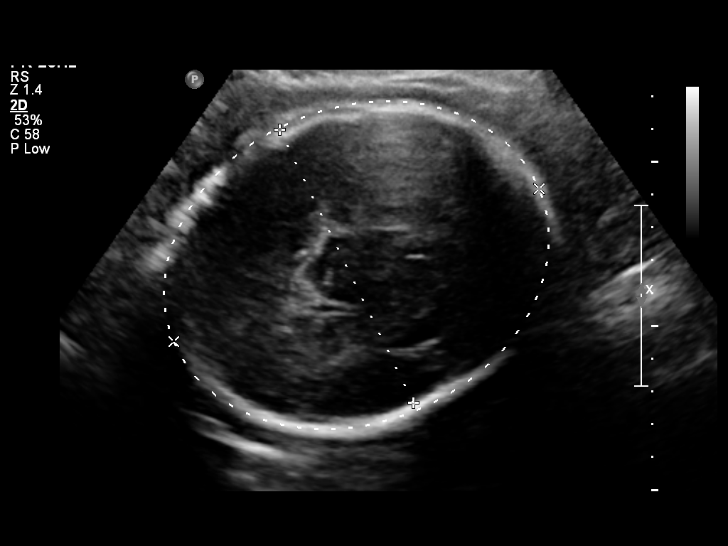
[im 26/29]
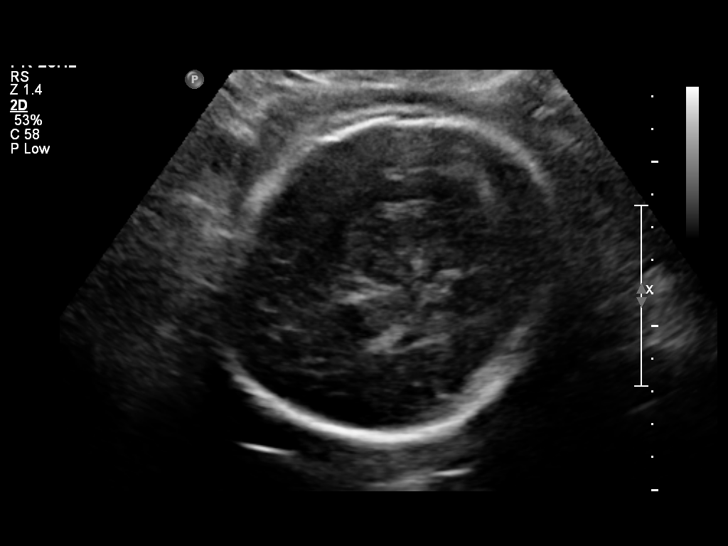
[im 29/29]
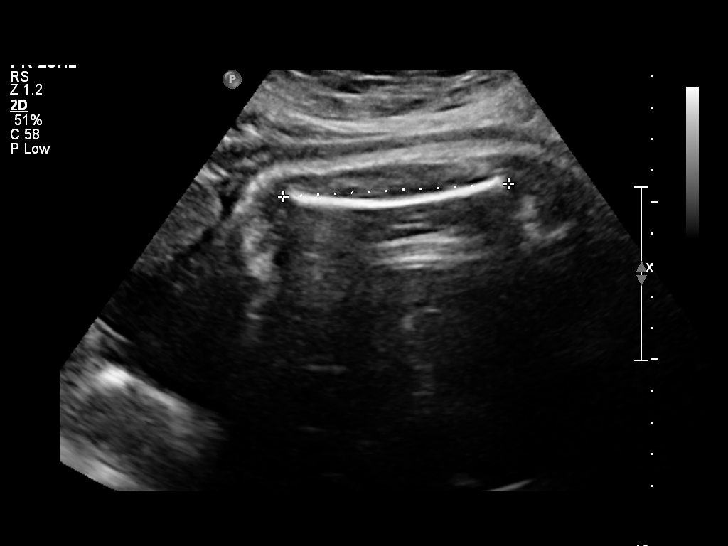

[14 of 28 positions shown; findings below may reference images not displayed]

IMPRESSION: See AS Obstetric US report.

## 2010-11-24 ENCOUNTER — Encounter: Payer: Self-pay | Admitting: Internal Medicine

## 2010-11-26 ENCOUNTER — Encounter: Payer: Self-pay | Admitting: Obstetrics & Gynecology

## 2010-12-06 NOTE — Letter (Signed)
Summary: Out of Work  [facility]  [address]   [place], [place] [address]   Phone: [phone]  Fax: [phone]    January 09, 2010   Employee:  Tara Hayes [facility]    To Whom It May Concern:   [name] was evaluated in our clinic this evening.    If you need additional information, please feel free to contact our office.         Sincerely,    Donna Christen MD

## 2010-12-06 NOTE — Letter (Signed)
Summary: Out of Work  [facility]  [address]   [place], [place] [address]   Phone: [phone]  Fax: [phone]    January 12, 2010   Employee:  KYANN HEY[employer] [facility]    To Whom It May Concern:   For Medical reasons, please excuse the above named employee from work 01/09/2010 through 01/14/2010.    If you need additional information, please feel free to contact our office.         Sincerely,    Donna Christen MD

## 2010-12-06 NOTE — Progress Notes (Signed)
  Phone Note Call from Patient   Caller: Patient Summary of Call: Patient called she saw the endo doctor and he said it was not her Thyroid causing the strep throat. She wants to know if we can refer her to ENT or if she needs to schedule with a PCP. Please call @ [phone].  Initial call taken by: Dannette Barbara,  February 15, 2010 2:10 PM     Attempted to call both home and work numbers; no answer. Donna Christen MD  February 16, 2010 8:56 AM   Patient returned call.  Sore throat improved with antibiotic and has now recurred. Recommend referral to ENT for further evaluation. Donna Christen MD  February 16, 2010 10:20 AM
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
## 2010-12-06 NOTE — Progress Notes (Signed)
  Phone Note Outgoing Call   Call placed by: Haskell Riling Call placed to: [facility] Action Taken: Appt scheduled Summary of Call: Tara Hayes has an appointment scheduled with Dr. Talmage Nap @ [facility] (Endocrinology) on 02/22/10@ 9:30am.  They have put her on a cancellation list.  Patient is aware of appointment.  WB Initial call taken by: Haskell Riling
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
## 2010-12-06 NOTE — Letter (Signed)
Summary: Out of Work  [facility]  [address]   [place], [place] [address]   Phone: [phone]  Fax: [phone]    January 12, 2010   Employee:  Tara Hayes [facility] [employer] [facility]    To Whom It May Concern:   For Medical reasons, please excuse the above named employee from work from 01/09/2010 through 01/12/2010.    If you need additional information, please feel free to contact our office.         Sincerely,    Donna Christen MD

## 2011-02-05 LAB — CBC
MCHC: 33.1 g/dL (ref 30.0–36.0)
Platelets: 293 10*3/uL (ref 150–400)
RDW: 15.4 % (ref 11.5–15.5)

## 2011-02-06 LAB — COMPREHENSIVE METABOLIC PANEL
ALT: 10 U/L (ref 0–35)
ALT: 13 U/L (ref 0–35)
AST: 19 U/L (ref 0–37)
Albumin: 2.8 g/dL — ABNORMAL LOW (ref 3.5–5.2)
Albumin: 2.8 g/dL — ABNORMAL LOW (ref 3.5–5.2)
Alkaline Phosphatase: 94 U/L (ref 39–117)
Alkaline Phosphatase: 96 U/L (ref 39–117)
BUN: 1 mg/dL — ABNORMAL LOW (ref 6–23)
BUN: 5 mg/dL — ABNORMAL LOW (ref 6–23)
Chloride: 102 mEq/L (ref 96–112)
Chloride: 106 mEq/L (ref 96–112)
GFR calc Af Amer: >60 mL/min (ref >60)
Glucose, Bld: 86 mg/dL (ref 70–99)
Potassium: 3.8 mEq/L (ref 3.5–5.1)
Potassium: 3.9 mEq/L (ref 3.5–5.1)
Sodium: 133 mEq/L — ABNORMAL LOW (ref 135–145)
Total Bilirubin: 0.4 mg/dL (ref 0.3–1.2)
Total Bilirubin: 0.5 mg/dL (ref 0.3–1.2)

## 2011-02-06 LAB — URINE MICROSCOPIC-ADD ON

## 2011-02-06 LAB — URINALYSIS, ROUTINE W REFLEX MICROSCOPIC
Bilirubin Urine: NEGATIVE
Bilirubin Urine: NEGATIVE
Glucose, UA: NEGATIVE mg/dL
Ketones, ur: 15 mg/dL — AB
Protein, ur: NEGATIVE mg/dL
Specific Gravity, Urine: <1.005 — ABNORMAL LOW (ref 1.005–1.030)
Urobilinogen, UA: 0.2 mg/dL (ref 0.0–1.0)

## 2011-02-06 LAB — CBC
HCT: 31.1 % — ABNORMAL LOW (ref 36.0–46.0)
HCT: 32.4 % — ABNORMAL LOW (ref 36.0–46.0)
Hemoglobin: 10.8 g/dL — ABNORMAL LOW (ref 12.0–15.0)
Platelets: 261 10*3/uL (ref 150–400)
WBC: 10.5 10*3/uL (ref 4.0–10.5)
WBC: 8.6 10*3/uL (ref 4.0–10.5)

## 2011-02-06 LAB — URIC ACID: Uric Acid, Serum: 4.2 mg/dL (ref 2.4–7.0)

## 2011-02-06 LAB — AMYLASE: Amylase: 93 U/L (ref 27–131)

## 2011-02-08 LAB — CBC
Hemoglobin: 11 g/dL — ABNORMAL LOW (ref 12.0–15.0)
MCHC: 33.8 g/dL (ref 30.0–36.0)
MCV: 91.5 fL (ref 78.0–100.0)
RDW: 13.8 % (ref 11.5–15.5)

## 2011-02-08 LAB — COMPREHENSIVE METABOLIC PANEL
ALT: 11 U/L (ref 0–35)
BUN: 5 mg/dL — ABNORMAL LOW (ref 6–23)
Calcium: 9.1 mg/dL (ref 8.4–10.5)
Creatinine, Ser: 0.31 mg/dL — ABNORMAL LOW (ref 0.4–1.2)
GFR calc non Af Amer: >60 mL/min (ref >60)
Glucose, Bld: 78 mg/dL (ref 70–99)
Sodium: 136 mEq/L (ref 135–145)
Total Protein: 6.5 g/dL (ref 6.0–8.3)

## 2011-02-08 LAB — LIPASE, BLOOD: Lipase: 17 U/L (ref 11–59)

## 2011-02-08 LAB — CK TOTAL AND CKMB (NOT AT ARMC)
CK, MB: 2.7 ng/mL (ref 0.3–4.0)
Relative Index: INVALID (ref 0.0–2.5)
Total CK: 40 U/L (ref 7–177)

## 2011-02-08 LAB — H. PYLORI ANTIBODY, IGG: H Pylori IgG: <0.4 {ISR}

## 2011-02-09 LAB — URINE CULTURE

## 2011-02-09 LAB — URINALYSIS, ROUTINE W REFLEX MICROSCOPIC
Bilirubin Urine: NEGATIVE
Glucose, UA: NEGATIVE mg/dL
Ketones, ur: NEGATIVE mg/dL
Protein, ur: NEGATIVE mg/dL
Urobilinogen, UA: 0.2 mg/dL (ref 0.0–1.0)

## 2011-02-09 LAB — URINE MICROSCOPIC-ADD ON

## 2011-02-10 LAB — URINALYSIS, ROUTINE W REFLEX MICROSCOPIC
Bilirubin Urine: NEGATIVE
Ketones, ur: NEGATIVE mg/dL
Nitrite: NEGATIVE
Specific Gravity, Urine: 1.01 (ref 1.005–1.030)
Urobilinogen, UA: 0.2 mg/dL (ref 0.0–1.0)

## 2011-02-10 LAB — URINE MICROSCOPIC-ADD ON

## 2011-02-11 LAB — URINALYSIS, ROUTINE W REFLEX MICROSCOPIC
Ketones, ur: >80 mg/dL — AB
Nitrite: NEGATIVE
Protein, ur: 30 mg/dL — AB
Urobilinogen, UA: 0.2 mg/dL (ref 0.0–1.0)

## 2011-02-11 LAB — URINE CULTURE

## 2011-02-11 LAB — URINE MICROSCOPIC-ADD ON

## 2011-02-12 LAB — COMPREHENSIVE METABOLIC PANEL
Albumin: 3.4 g/dL — ABNORMAL LOW (ref 3.5–5.2)
Alkaline Phosphatase: 47 U/L (ref 39–117)
BUN: 7 mg/dL (ref 6–23)
Creatinine, Ser: 0.5 mg/dL (ref 0.4–1.2)
Potassium: 3.6 mEq/L (ref 3.5–5.1)
Total Protein: 6.8 g/dL (ref 6.0–8.3)

## 2011-02-12 LAB — BILE ACIDS, TOTAL: Bile Acids Total: 4 umol/L (ref 0–10)

## 2011-02-12 LAB — CBC
HCT: 33.1 % — ABNORMAL LOW (ref 36.0–46.0)
Platelets: 221 10*3/uL (ref 150–400)
RDW: 14.5 % (ref 11.5–15.5)

## 2011-02-13 LAB — LIPASE, BLOOD: Lipase: 62 U/L (ref 23–300)

## 2011-02-13 LAB — URINE MICROSCOPIC-ADD ON

## 2011-02-13 LAB — COMPREHENSIVE METABOLIC PANEL
ALT: 23 U/L (ref 0–35)
ALT: 17 U/L (ref 0–35)
AST: 25 U/L (ref 0–37)
Alkaline Phosphatase: 39 U/L (ref 39–117)
CO2: 24 mEq/L (ref 19–32)
Calcium: 9 mg/dL (ref 8.4–10.5)
Chloride: 107 mEq/L (ref 96–112)
Creatinine, Ser: 0.6 mg/dL (ref 0.4–1.2)
GFR calc Af Amer: >60 mL/min (ref >60)
GFR calc non Af Amer: >60 mL/min (ref >60)
Glucose, Bld: 105 mg/dL — ABNORMAL HIGH (ref 70–99)
Glucose, Bld: 112 mg/dL — ABNORMAL HIGH (ref 70–99)
Potassium: 3.8 mEq/L (ref 3.5–5.1)
Sodium: 137 mEq/L (ref 135–145)
Sodium: 140 mEq/L (ref 135–145)
Total Protein: 7.5 g/dL (ref 6.0–8.3)

## 2011-02-13 LAB — URINALYSIS, ROUTINE W REFLEX MICROSCOPIC
Bilirubin Urine: NEGATIVE
Nitrite: NEGATIVE
Specific Gravity, Urine: 1.018 (ref 1.005–1.030)
Urobilinogen, UA: 0.2 mg/dL (ref 0.0–1.0)
pH: 6 (ref 5.0–8.0)

## 2011-02-13 LAB — DIFFERENTIAL
Eosinophils Absolute: 0.2 10*3/uL (ref 0.0–0.7)
Lymphocytes Relative: 35 % (ref 12–46)
Lymphs Abs: 2.7 10*3/uL (ref 0.7–4.0)
Monocytes Relative: 10 % (ref 3–12)
Neutrophils Relative %: 52 % (ref 43–77)

## 2011-02-13 LAB — CBC
Hemoglobin: 12.1 g/dL (ref 12.0–15.0)
MCHC: 34.7 g/dL (ref 30.0–36.0)
MCV: 90.1 fL (ref 78.0–100.0)
RBC: 3.95 MIL/uL (ref 3.87–5.11)
RDW: 13.3 % (ref 11.5–15.5)
WBC: 6 10*3/uL (ref 4.0–10.5)

## 2011-02-13 LAB — HCG, QUANTITATIVE, PREGNANCY: hCG, Beta Chain, Quant, S: 551 m[IU]/mL — ABNORMAL HIGH (ref <5)

## 2011-02-13 LAB — WET PREP, GENITAL
Clue Cells Wet Prep HPF POC: NONE SEEN
Trich, Wet Prep: NONE SEEN
Yeast Wet Prep HPF POC: NONE SEEN

## 2011-02-13 LAB — PREGNANCY, URINE: Preg Test, Ur: POSITIVE

## 2011-02-13 LAB — GC/CHLAMYDIA PROBE AMP, GENITAL: GC Probe Amp, Genital: NEGATIVE

## 2011-02-13 LAB — URINE CULTURE

## 2011-02-13 LAB — AMYLASE: Amylase: 73 U/L (ref 27–131)

## 2011-02-14 LAB — POCT PREGNANCY, URINE: Preg Test, Ur: POSITIVE

## 2011-02-18 ENCOUNTER — Emergency Department (INDEPENDENT_AMBULATORY_CARE_PROVIDER_SITE_OTHER): Payer: Self-pay

## 2011-02-18 ENCOUNTER — Emergency Department (HOSPITAL_BASED_OUTPATIENT_CLINIC_OR_DEPARTMENT_OTHER)
Admission: EM | Admit: 2011-02-18 | Discharge: 2011-02-18 | Disposition: A | Discharge status: Home / Self Care | Payer: Self-pay | Attending: Emergency Medicine | Admitting: Emergency Medicine

## 2011-02-18 DIAGNOSIS — Y9383 Activity, rough housing and horseplay: Secondary | ICD-10-CM | POA: Insufficient documentation

## 2011-02-18 DIAGNOSIS — M25519 Pain in unspecified shoulder: Secondary | ICD-10-CM | POA: Insufficient documentation

## 2011-02-18 DIAGNOSIS — X500XXA Overexertion from strenuous movement or load, initial encounter: Secondary | ICD-10-CM

## 2011-02-18 DIAGNOSIS — X58XXXA Exposure to other specified factors, initial encounter: Secondary | ICD-10-CM | POA: Insufficient documentation

## 2011-02-18 DIAGNOSIS — Y9229 Other specified public building as the place of occurrence of the external cause: Secondary | ICD-10-CM | POA: Insufficient documentation

## 2011-02-18 DIAGNOSIS — K589 Irritable bowel syndrome without diarrhea: Secondary | ICD-10-CM | POA: Insufficient documentation

## 2011-02-18 DIAGNOSIS — F341 Dysthymic disorder: Secondary | ICD-10-CM | POA: Insufficient documentation

## 2011-03-19 NOTE — Assessment & Plan Note (Signed)
NAMECHRISELDA, LEPPERT               ACCOUNT NO.:  [id number]   MEDICAL RECORD NO.:  [id number]          PATIENT TYPE:  POB   LOCATION:  [facility] at [place]         FACILITY:  [facility]   PHYSICIAN:  Elsie Lincoln, MD      DATE OF BIRTH:  [birth year]   DATE OF SERVICE:                                  CLINIC NOTE   The patient is a 30 year old para 1 female, who presents with a 6-week 1-  day IUFD.  She has had multiple ultrasounds both at our office and  Radiology that confirmed a 6-week 1-day missed AB.  The patient was  given options of watchful waiting, Cytotec, and D&C.  Due to financial  concerns, the patient would like to do Cytotec.  Her hemoglobin today is  over 12.  She understands that she should come in if she soaks a pad in  an hour.  She was given pads as she cannot afford pads on her own.  We  did MAU check list for Cytotec with early pregnancy failure.  We will  draw only blood type and Rh today to make sure she is Rh positive.  She  was given a prescription for Cytotec 800 mcg vaginally administered  tonight.  She is still taking ibuprofen 600 mg every 6 hours, and she  was also given a prescription for Tylenol No. 3 one to two tablets p.o.  q.4-6 h. p.r.n., #15.  The patient does have transportation to the  hospital and will be with her mother in case she does start to  hemorrhage.  If this does not work, she can administer Cytotec one more  time.  Instead, I would recommend a D&E.   PAST MEDICAL HISTORY:  Denies all problems.   PAST SURGICAL HISTORY:  Denies surgeries.   MEDICATIONS:  None.   ALLERGIES:  LATEX.   PHYSICAL EXAMINATION:  VITAL SIGNS:  Blood pressure 110/70, weight 232,  pulse 82.  ABDOMEN:  Soft and nontender.  No rebound.  No guarding.  GENITALIA:  Tanner V.  Vagina pink, normal rugae.  Cervix closed,  nontender.  Uterus is small, nontender.  Adnexa, no masses, nontender.   ASSESSMENT AND PLAN:  A 30 year old female with missed abortion at 6  weeks for Cytotec.  Medications given as above.  The patient will return  to the clinic in 2 weeks for an urine pregnancy test.           ______________________________  Elsie Lincoln, MD     KL/MEDQ  D:  09/14/2008  T:  09/14/2008  Job:  [id number]

## 2011-03-19 NOTE — Assessment & Plan Note (Signed)
Tara Hayes, Tara Hayes               ACCOUNT NO.:  [id number]   MEDICAL RECORD NO.:  [id number]          PATIENT TYPE:  POB   LOCATION:  [facility] at [place]         FACILITY:  [facility]   PHYSICIAN:  Sid Falcon, CNM  DATE OF BIRTH:  1980-11-16   DATE OF SERVICE:                                  CLINIC NOTE   REASON FOR VISIT:  Continued right lower pelvic pain, status post normal  spontaneous vaginal delivery, and bilateral tubal ligation on October 05, 2009.   Reading of the OP note does show that there were some adhesions noted  during the operation.  The patient is reporting that she is experiencing  continued pain throughout lower abdomen, relief with medication.  She  states that she is taking approximately 1-2 Percocet twice a day for the  pain with relief, and taking ibuprofen at the same time.  The patient  also reports change in bowel pattern, approximately 1 or 2 loose stools  in the last 5 days.  No fever.  No body aches.  No chills.  Vaginal  bleeding has decreased.  No UTI symptoms and pain in the back where the  epidural was placed.  No other complaints at this visit.   PHYSICAL EXAMINATION:  GENERAL:  The patient is alert and oriented x3,  ambulating without difficulty.  VITAL SIGNS:  Stable.  Temperature 97, pulse 83, blood pressure 135/79,  weight 241.  ABDOMEN:  Incision site without signs of infection.  Nontender to  palpation.  No masses palpated.   ASSESSMENT:  Lower pelvic pain, status post bilateral tubal ligation.   PLAN:  Consulted with Dr. Macon Large about anticipatory expected pain after  lysis of adhesions during an OP.  Informed refilling the prescriptions  one more time.  It is typical with that type of procedure.  Refill  Percocet 1-2 b.i.d. as needed for pain, #40, explained to patient that  there will be no additional refills and will need to really investigate  her source of pain further if it is not relieved in the next 2 weeks.  I  have  scheduled an appointment in 4 weeks for normal postpartum exam.      Sid Falcon, CNM     WM/MEDQ  D:  10/16/2009  T:  10/17/2009  Job:  [id number]

## 2011-03-22 NOTE — Op Note (Signed)
[place]  Patient:    Tara Hayes                       MRN: [id number] Proc. Date: 11/16/99 Adm. Date:  [id number] Attending:  F[place] Pho Damodar CC:         Teena Irani. Arlyce Dice, M.D.                           Operative Report  PREOPERATIVE DIAGNOSIS:  Acute appendicitis, retrocecal.  POSTOPERATIVE DIAGNOSIS:  Acute appendicitis, retrocecal.  OPERATION:  Exploratory laparotomy and appendectomy.  SURGEON:  Prabhakar D. Levie Heritage, M.D.  ASSISTANT:  Nurse.  ANESTHESIA:  Nurse.  DESCRIPTION OF PROCEDURE:  Under satisfactory general endotracheal anesthesia, he patient in the supine position, the abdomen was thoroughly prepped and draped in the usual manner.  By palpation, there was no mass lesion appreciated in the right lower quadrant area.  However, CT scan showed the appendiceal base to be at the  level of the anterior-superior iliac spine.  It was about two inches long.  A transverse incision was made at the level of the anterior-superior iliac spine. Skin and subcutaneous tissue incised.  Bleeders individually clamped, cut, and electrocoagulated.  Muscle was incised in the McBurneys fashion.  Peritoneal cavity entered.  The findings are as described above.  There was no appreciable  free fluid in the peritoneal cavity.  Appendix was about four inches long and retrocecal position markedly adherent to the posterior wall of the cecum.  Distal one-third of the appendix was bulbous,  indurated, and edematous suggestive of suppurative appendicitis.  The cecum as well as the terminal ileum showed no other pathological changes. Examination of the distal ileum showed no evidence of Meckels diverticulum.  Hence at this time, appendectomy was planned.  Appendix was freed from the posterior wall of the cecum in retrograde dissection fashion.  After complete separation of the appendix, appendectomy was done in the routine fashion.   The stump was buried in the cecal wall.  Hemostasis was satisfactory.  The bowel was returned to the peritoneal cavity.  Sponge and needle count being  correct.  Peritoneum closed with 2-0 Vicryl running interlocking sutures.  The individual muscles with dual Vicryl interrupted sutures, subcutaneous tissue with 2-0 Vicryl, skin closed with 4-0 Monocryl subcuticular sutures, Steri-Strips applied, and appropriate dressing applied.  Throughout the procedure, the patients vital signs remained stable.  The patient withstood the procedure well and was transferred to the recovery room in satisfactory general condition.DD:  11/16/99 TD:  11/17/99 Job: [id number]
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
## 2011-04-04 ENCOUNTER — Emergency Department (HOSPITAL_BASED_OUTPATIENT_CLINIC_OR_DEPARTMENT_OTHER)
Admission: EM | Admit: 2011-04-04 | Discharge: 2011-04-04 | Disposition: A | Discharge status: Home / Self Care | Payer: Medicaid Other | Attending: Emergency Medicine | Admitting: Emergency Medicine

## 2011-04-04 DIAGNOSIS — K589 Irritable bowel syndrome without diarrhea: Secondary | ICD-10-CM | POA: Insufficient documentation

## 2011-04-04 DIAGNOSIS — R112 Nausea with vomiting, unspecified: Secondary | ICD-10-CM | POA: Insufficient documentation

## 2011-04-04 DIAGNOSIS — F341 Dysthymic disorder: Secondary | ICD-10-CM | POA: Insufficient documentation

## 2011-04-04 DIAGNOSIS — R197 Diarrhea, unspecified: Secondary | ICD-10-CM | POA: Insufficient documentation

## 2011-04-04 LAB — DIFFERENTIAL
Eosinophils Relative: 2 % (ref 0–5)
Lymphocytes Relative: 22 % (ref 12–46)
Lymphs Abs: 2.6 10*3/uL (ref 0.7–4.0)
Neutro Abs: 7.8 10*3/uL — ABNORMAL HIGH (ref 1.7–7.7)

## 2011-04-04 LAB — URINE MICROSCOPIC-ADD ON

## 2011-04-04 LAB — COMPREHENSIVE METABOLIC PANEL
ALT: 18 U/L (ref 0–35)
AST: 18 U/L (ref 0–37)
Calcium: 9.7 mg/dL (ref 8.4–10.5)
Creatinine, Ser: 0.7 mg/dL (ref 0.4–1.2)
GFR calc Af Amer: >60 mL/min (ref >60)
Sodium: 138 mEq/L (ref 135–145)
Total Protein: 7.9 g/dL (ref 6.0–8.3)

## 2011-04-04 LAB — CBC
HCT: 41.9 % (ref 36.0–46.0)
Hemoglobin: 14 g/dL (ref 12.0–15.0)
MCV: 88.4 fL (ref 78.0–100.0)
RDW: 14.8 % (ref 11.5–15.5)
WBC: 11.6 10*3/uL — ABNORMAL HIGH (ref 4.0–10.5)

## 2011-04-04 LAB — PREGNANCY, URINE: Preg Test, Ur: NEGATIVE

## 2011-04-04 LAB — URINALYSIS, ROUTINE W REFLEX MICROSCOPIC
Bilirubin Urine: NEGATIVE
Glucose, UA: NEGATIVE mg/dL
Specific Gravity, Urine: 1.005 (ref 1.005–1.030)
pH: 6 (ref 5.0–8.0)

## 2011-04-06 LAB — URINE CULTURE
Colony Count: NO GROWTH
Culture  Setup Time: 201206010640

## 2011-06-19 ENCOUNTER — Encounter: Payer: Self-pay | Admitting: Cardiology

## 2011-08-09 ENCOUNTER — Encounter (HOSPITAL_BASED_OUTPATIENT_CLINIC_OR_DEPARTMENT_OTHER): Payer: Self-pay | Admitting: *Deleted

## 2011-08-09 ENCOUNTER — Emergency Department (HOSPITAL_BASED_OUTPATIENT_CLINIC_OR_DEPARTMENT_OTHER)
Admission: EM | Admit: 2011-08-09 | Discharge: 2011-08-09 | Disposition: A | Discharge status: Home / Self Care | Payer: Medicaid Other | Attending: Emergency Medicine | Admitting: Emergency Medicine

## 2011-08-09 DIAGNOSIS — R259 Unspecified abnormal involuntary movements: Secondary | ICD-10-CM | POA: Insufficient documentation

## 2011-08-09 DIAGNOSIS — F341 Dysthymic disorder: Secondary | ICD-10-CM | POA: Insufficient documentation

## 2011-08-09 DIAGNOSIS — K219 Gastro-esophageal reflux disease without esophagitis: Secondary | ICD-10-CM | POA: Insufficient documentation

## 2011-08-09 DIAGNOSIS — R251 Tremor, unspecified: Secondary | ICD-10-CM

## 2011-08-09 DIAGNOSIS — F419 Anxiety disorder, unspecified: Secondary | ICD-10-CM

## 2011-08-09 DIAGNOSIS — F17200 Nicotine dependence, unspecified, uncomplicated: Secondary | ICD-10-CM | POA: Insufficient documentation

## 2011-08-09 LAB — CBC
HCT: 38.5 % (ref 36.0–46.0)
Hemoglobin: 12.8 g/dL (ref 12.0–15.0)
MCH: 29.8 pg (ref 26.0–34.0)
MCHC: 33.2 g/dL (ref 30.0–36.0)
MCV: 89.7 fL (ref 78.0–100.0)
Platelets: 253 10*3/uL (ref 150–400)
RBC: 4.29 MIL/uL (ref 3.87–5.11)
RDW: 14.3 % (ref 11.5–15.5)
WBC: 9.1 10*3/uL (ref 4.0–10.5)

## 2011-08-09 LAB — COMPREHENSIVE METABOLIC PANEL
ALT: 15 U/L (ref 0–35)
AST: 15 U/L (ref 0–37)
Albumin: 3.9 g/dL (ref 3.5–5.2)
Alkaline Phosphatase: 52 U/L (ref 39–117)
BUN: 11 mg/dL (ref 6–23)
CO2: 23 mEq/L (ref 19–32)
Calcium: 9.6 mg/dL (ref 8.4–10.5)
Chloride: 105 mEq/L (ref 96–112)
Creatinine, Ser: 0.5 mg/dL (ref 0.50–1.10)
GFR calc Af Amer: >90 mL/min (ref >90)
GFR calc non Af Amer: >90 mL/min (ref >90)
Glucose, Bld: 93 mg/dL (ref 70–99)
Potassium: 3.6 mEq/L (ref 3.5–5.1)
Sodium: 140 mEq/L (ref 135–145)
Total Bilirubin: 0.4 mg/dL (ref 0.3–1.2)
Total Protein: 7.5 g/dL (ref 6.0–8.3)

## 2011-08-09 LAB — URINALYSIS, ROUTINE W REFLEX MICROSCOPIC
Bilirubin Urine: NEGATIVE
Glucose, UA: NEGATIVE mg/dL
Hgb urine dipstick: NEGATIVE
Ketones, ur: NEGATIVE mg/dL
Leukocytes, UA: NEGATIVE
Nitrite: NEGATIVE
Protein, ur: NEGATIVE mg/dL
Specific Gravity, Urine: 1.023 (ref 1.005–1.030)
Urobilinogen, UA: 0.2 mg/dL (ref 0.0–1.0)
pH: 6 (ref 5.0–8.0)

## 2011-08-09 LAB — DIFFERENTIAL
Basophils Absolute: 0 10*3/uL (ref 0.0–0.1)
Basophils Relative: 0 % (ref 0–1)
Eosinophils Absolute: 0.2 10*3/uL (ref 0.0–0.7)
Eosinophils Relative: 2 % (ref 0–5)
Lymphocytes Relative: 36 % (ref 12–46)
Lymphs Abs: 3.2 10*3/uL (ref 0.7–4.0)
Monocytes Absolute: 0.9 10*3/uL (ref 0.1–1.0)
Monocytes Relative: 10 % (ref 3–12)
Neutro Abs: 4.8 10*3/uL (ref 1.7–7.7)
Neutrophils Relative %: 53 % (ref 43–77)

## 2011-08-09 LAB — PREGNANCY, URINE: Preg Test, Ur: NEGATIVE

## 2011-08-09 NOTE — ED Notes (Signed)
Brought in by EMS  , ? psuedo seizures, seen at [facility] ed last night for same

## 2011-08-09 NOTE — ED Notes (Signed)
At discharge pt is A&O x 4, ambulatory to discharge without difficulty with "God mother".

## 2011-08-09 NOTE — ED Notes (Signed)
Pt having muscle tremors and stiffening up, however during procedures such as cath and blood draw pt is awake, alert, and not demonstrating these tremors.
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
## 2014-03-01 ENCOUNTER — Encounter (HOSPITAL_BASED_OUTPATIENT_CLINIC_OR_DEPARTMENT_OTHER): Payer: Self-pay | Admitting: Emergency Medicine

## 2014-03-01 ENCOUNTER — Emergency Department (HOSPITAL_BASED_OUTPATIENT_CLINIC_OR_DEPARTMENT_OTHER)
Admission: EM | Admit: 2014-03-01 | Discharge: 2014-03-01 | Disposition: A | Discharge status: Home / Self Care | Payer: Medicaid Other | Attending: Emergency Medicine | Admitting: Emergency Medicine

## 2014-03-01 DIAGNOSIS — Z8719 Personal history of other diseases of the digestive system: Secondary | ICD-10-CM | POA: Insufficient documentation

## 2014-03-01 DIAGNOSIS — Z79899 Other long term (current) drug therapy: Secondary | ICD-10-CM | POA: Insufficient documentation

## 2014-03-01 DIAGNOSIS — N390 Urinary tract infection, site not specified: Secondary | ICD-10-CM

## 2014-03-01 DIAGNOSIS — F17200 Nicotine dependence, unspecified, uncomplicated: Secondary | ICD-10-CM | POA: Insufficient documentation

## 2014-03-01 DIAGNOSIS — IMO0002 Reserved for concepts with insufficient information to code with codable children: Secondary | ICD-10-CM | POA: Insufficient documentation

## 2014-03-01 DIAGNOSIS — Z91040 Latex allergy status: Secondary | ICD-10-CM | POA: Insufficient documentation

## 2014-03-01 DIAGNOSIS — Z872 Personal history of diseases of the skin and subcutaneous tissue: Secondary | ICD-10-CM | POA: Insufficient documentation

## 2014-03-01 DIAGNOSIS — Z3202 Encounter for pregnancy test, result negative: Secondary | ICD-10-CM | POA: Insufficient documentation

## 2014-03-01 DIAGNOSIS — Z8659 Personal history of other mental and behavioral disorders: Secondary | ICD-10-CM | POA: Insufficient documentation

## 2014-03-01 LAB — URINE MICROSCOPIC-ADD ON

## 2014-03-01 LAB — URINALYSIS, ROUTINE W REFLEX MICROSCOPIC
BILIRUBIN URINE: NEGATIVE
Glucose, UA: NEGATIVE mg/dL
HGB URINE DIPSTICK: NEGATIVE
Ketones, ur: NEGATIVE mg/dL
Nitrite: POSITIVE — AB
PROTEIN: NEGATIVE mg/dL
Specific Gravity, Urine: 1.019 (ref 1.005–1.030)
UROBILINOGEN UA: 1 mg/dL (ref 0.0–1.0)
pH: 5.5 (ref 5.0–8.0)

## 2014-03-01 LAB — PREGNANCY, URINE: Preg Test, Ur: NEGATIVE

## 2014-03-01 MED ORDER — CEPHALEXIN 500 MG PO CAPS: 500.0000 mg | ORAL_CAPSULE | Freq: Four times a day (QID) | ORAL | Status: DC

## 2014-03-01 MED ORDER — CEPHALEXIN 250 MG PO CAPS
500.0000 mg | ORAL_CAPSULE | Freq: Once | ORAL | Status: AC
  Administered 2014-03-01: 500 mg via ORAL
  Filled 2014-03-01: qty 2

## 2014-03-01 MED ORDER — FLUCONAZOLE 150 MG PO TABS: 150.0000 mg | ORAL_TABLET | Freq: Once | ORAL | Status: DC

## 2014-03-01 MED ORDER — IBUPROFEN 600 MG PO TABS: 600.0000 mg | ORAL_TABLET | Freq: Four times a day (QID) | ORAL | Status: AC | PRN

## 2014-03-01 NOTE — Discharge Instructions (Signed)
Urinary Tract Infection  Urinary tract infections (UTIs) can develop anywhere along your urinary tract. Your urinary tract is your body's drainage system for removing wastes and extra water. Your urinary tract includes two kidneys, two ureters, a bladder, and a urethra. Your kidneys are a pair of bean-shaped organs. Each kidney is about the size of your fist. They are located below your ribs, one on each side of your spine.  CAUSES  Infections are caused by microbes, which are microscopic organisms, including fungi, viruses, and bacteria. These organisms are so small that they can only be seen through a microscope. Bacteria are the microbes that most commonly cause UTIs.  SYMPTOMS   Symptoms of UTIs may vary by age and gender of the patient and by the location of the infection. Symptoms in young women typically include a frequent and intense urge to urinate and a painful, burning feeling in the bladder or urethra during urination. Older women and men are more likely to be tired, shaky, and weak and have muscle aches and abdominal pain. A fever may mean the infection is in your kidneys. Other symptoms of a kidney infection include pain in your back or sides below the ribs, nausea, and vomiting.  DIAGNOSIS  To diagnose a UTI, your caregiver will ask you about your symptoms. Your caregiver also will ask to provide a urine sample. The urine sample will be tested for bacteria and white blood cells. White blood cells are made by your body to help fight infection.  TREATMENT   Typically, UTIs can be treated with medication. Because most UTIs are caused by a bacterial infection, they usually can be treated with the use of antibiotics. The choice of antibiotic and length of treatment depend on your symptoms and the type of bacteria causing your infection.  HOME CARE INSTRUCTIONS   If you were prescribed antibiotics, take them exactly as your caregiver instructs you. Finish the medication even if you feel better after you  have only taken some of the medication.   Drink enough water and fluids to keep your urine clear or pale yellow.   Avoid caffeine, tea, and carbonated beverages. They tend to irritate your bladder.   Empty your bladder often. Avoid holding urine for long periods of time.   Empty your bladder before and after sexual intercourse.   After a bowel movement, women should cleanse from front to back. Use each tissue only once.  SEEK MEDICAL CARE IF:    You have back pain.   You develop a fever.   Your symptoms do not begin to resolve within 3 days.  SEEK IMMEDIATE MEDICAL CARE IF:    You have severe back pain or lower abdominal pain.   You develop chills.   You have nausea or vomiting.   You have continued burning or discomfort with urination.  MAKE SURE YOU:    Understand these instructions.   Will watch your condition.   Will get help right away if you are not doing well or get worse.  Document Released: 07/31/2005 Document Revised: 04/21/2012 Document Reviewed: 11/29/2011  [employer] Patient Information 2014 [employer].

## 2014-03-01 NOTE — ED Notes (Signed)
States she has had a UTI for months but has been putting off having a urine test. Weak, tired.
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
## 2014-03-04 LAB — URINE CULTURE: Colony Count: 60000

## 2014-03-06 ENCOUNTER — Telehealth (HOSPITAL_BASED_OUTPATIENT_CLINIC_OR_DEPARTMENT_OTHER): Payer: Self-pay | Admitting: Emergency Medicine

## 2014-03-06 NOTE — Telephone Encounter (Signed)
Post ED Visit - Positive Culture Follow-up  Culture report reviewed by antimicrobial stewardship pharmacist: []  Wes Dulaney, Pharm.D., BCPS []  Celedonio MiyamotoJeremy Frens, Pharm.D., BCPS []  Georgina PillionElizabeth Martin, Pharm.D., BCPS [x]  IdanhaMinh Pham, [address]Pharm[address]D., BCPS, AAHIVP []  Estella HuskMichelle Turner, Pharm.D., BCPS, AAHIVP []  Harvie JuniorNathan Cope, Pharm.D.  Positive urine culture Treated with Keflex, organism sensitive to the same and no further patient follow-up is required at this time.  Mane Consolo 03/06/2014, 6:15 PM

## 2014-12-10 ENCOUNTER — Emergency Department (HOSPITAL_COMMUNITY)
Admission: EM | Admit: 2014-12-10 | Discharge: 2014-12-10 | Disposition: A | Discharge status: Home / Self Care | Payer: Medicaid Other | Attending: Emergency Medicine | Admitting: Emergency Medicine

## 2014-12-10 DIAGNOSIS — Z79899 Other long term (current) drug therapy: Secondary | ICD-10-CM | POA: Diagnosis not present

## 2014-12-10 DIAGNOSIS — Z8659 Personal history of other mental and behavioral disorders: Secondary | ICD-10-CM | POA: Diagnosis not present

## 2014-12-10 DIAGNOSIS — Z3202 Encounter for pregnancy test, result negative: Secondary | ICD-10-CM | POA: Insufficient documentation

## 2014-12-10 DIAGNOSIS — N390 Urinary tract infection, site not specified: Secondary | ICD-10-CM | POA: Insufficient documentation

## 2014-12-10 DIAGNOSIS — Z8719 Personal history of other diseases of the digestive system: Secondary | ICD-10-CM | POA: Diagnosis not present

## 2014-12-10 DIAGNOSIS — Z792 Long term (current) use of antibiotics: Secondary | ICD-10-CM | POA: Insufficient documentation

## 2014-12-10 DIAGNOSIS — Z872 Personal history of diseases of the skin and subcutaneous tissue: Secondary | ICD-10-CM | POA: Insufficient documentation

## 2014-12-10 DIAGNOSIS — Z720 Tobacco use: Secondary | ICD-10-CM | POA: Diagnosis not present

## 2014-12-10 DIAGNOSIS — Z91040 Latex allergy status: Secondary | ICD-10-CM | POA: Insufficient documentation

## 2014-12-10 DIAGNOSIS — R3915 Urgency of urination: Secondary | ICD-10-CM | POA: Diagnosis present

## 2014-12-10 LAB — URINALYSIS, ROUTINE W REFLEX MICROSCOPIC
GLUCOSE, UA: NEGATIVE mg/dL
Ketones, ur: 15 mg/dL — AB
Nitrite: POSITIVE — AB
PH: 5 (ref 5.0–8.0)
Protein, ur: 30 mg/dL — AB
Specific Gravity, Urine: 1.022 (ref 1.005–1.030)
Urobilinogen, UA: 2 mg/dL — ABNORMAL HIGH (ref 0.0–1.0)

## 2014-12-10 LAB — URINE MICROSCOPIC-ADD ON

## 2014-12-10 LAB — POC URINE PREG, ED: Preg Test, Ur: NEGATIVE

## 2014-12-10 MED ORDER — NITROFURANTOIN MONOHYD MACRO 100 MG PO CAPS: 100.0000 mg | ORAL_CAPSULE | Freq: Two times a day (BID) | ORAL | Status: AC

## 2014-12-10 MED ORDER — ONDANSETRON HCL 4 MG PO TABS: 4.0000 mg | ORAL_TABLET | Freq: Four times a day (QID) | ORAL | Status: AC

## 2014-12-10 MED ORDER — FLUCONAZOLE 150 MG PO TABS: 150.0000 mg | ORAL_TABLET | Freq: Every day | ORAL | Status: AC

## 2014-12-10 MED ORDER — ONDANSETRON 4 MG PO TBDP
4.0000 mg | ORAL_TABLET | Freq: Once | ORAL | Status: AC
  Administered 2014-12-10: 4 mg via ORAL
  Filled 2014-12-10: qty 1
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
## 2014-12-10 NOTE — ED Notes (Signed)
Patient to ED with C/O urinary frequency that began last Tuesday.

## 2014-12-13 LAB — URINE CULTURE: Colony Count: >=100000

## 2014-12-14 ENCOUNTER — Telehealth (HOSPITAL_BASED_OUTPATIENT_CLINIC_OR_DEPARTMENT_OTHER): Payer: Self-pay | Admitting: Emergency Medicine

## 2014-12-14 NOTE — Telephone Encounter (Signed)
Post ED Visit - Positive Culture Follow-up  Culture report reviewed by antimicrobial stewardship pharmacist: []  Wes Dulaney, Pharm.D., BCPS [x]  Celedonio MiyamotoJeremy Frens, Pharm.D., BCPS []  Georgina PillionElizabeth Martin, [address]Pharm[address]D., BCPS []  North LauderdaleMinh Pham, [address]Pharm[address]D., BCPS, AAHIVP []  Estella HuskMichelle Turner, Pharm.D., BCPS, AAHIVP []  Elder CyphersLorie Poole, [address]Pharm.D., BCP                       Urine culture +E. Coli,Treated with fluconazole, nitrofurantoin,, organism sensitive to the same and no further patient follow-up is required at this time.  Berle MullMiller, Antario Yasuda 12/14/2014, 5:03 PM

## 2015-01-20 ENCOUNTER — Emergency Department (HOSPITAL_COMMUNITY)
Admission: EM | Admit: 2015-01-20 | Discharge: 2015-01-20 | Disposition: A | Discharge status: Home / Self Care | Payer: Medicaid Other | Attending: Emergency Medicine | Admitting: Emergency Medicine

## 2015-01-20 ENCOUNTER — Emergency Department (HOSPITAL_COMMUNITY): Payer: Medicaid Other

## 2015-01-20 ENCOUNTER — Encounter (HOSPITAL_COMMUNITY): Payer: Self-pay | Admitting: Emergency Medicine

## 2015-01-20 DIAGNOSIS — N390 Urinary tract infection, site not specified: Secondary | ICD-10-CM

## 2015-01-20 DIAGNOSIS — Z872 Personal history of diseases of the skin and subcutaneous tissue: Secondary | ICD-10-CM | POA: Diagnosis not present

## 2015-01-20 DIAGNOSIS — Z8719 Personal history of other diseases of the digestive system: Secondary | ICD-10-CM | POA: Insufficient documentation

## 2015-01-20 DIAGNOSIS — M545 Low back pain, unspecified: Secondary | ICD-10-CM

## 2015-01-20 DIAGNOSIS — Z91040 Latex allergy status: Secondary | ICD-10-CM | POA: Diagnosis not present

## 2015-01-20 DIAGNOSIS — Z79899 Other long term (current) drug therapy: Secondary | ICD-10-CM | POA: Insufficient documentation

## 2015-01-20 DIAGNOSIS — R000 Tachycardia, unspecified: Secondary | ICD-10-CM | POA: Diagnosis not present

## 2015-01-20 DIAGNOSIS — Z8659 Personal history of other mental and behavioral disorders: Secondary | ICD-10-CM | POA: Diagnosis not present

## 2015-01-20 DIAGNOSIS — Z792 Long term (current) use of antibiotics: Secondary | ICD-10-CM | POA: Insufficient documentation

## 2015-01-20 DIAGNOSIS — Z720 Tobacco use: Secondary | ICD-10-CM | POA: Diagnosis not present

## 2015-01-20 DIAGNOSIS — Z3202 Encounter for pregnancy test, result negative: Secondary | ICD-10-CM | POA: Insufficient documentation

## 2015-01-20 LAB — CBC WITH DIFFERENTIAL/PLATELET
BASOS ABS: 0 10*3/uL (ref 0.0–0.1)
Basophils Relative: 0 % (ref 0–1)
Eosinophils Absolute: 0 10*3/uL (ref 0.0–0.7)
Eosinophils Relative: 0 % (ref 0–5)
HCT: 40.7 % (ref 36.0–46.0)
HEMOGLOBIN: 13.6 g/dL (ref 12.0–15.0)
LYMPHS ABS: 2.1 10*3/uL (ref 0.7–4.0)
LYMPHS PCT: 16 % (ref 12–46)
MCH: 31.1 pg (ref 26.0–34.0)
MCHC: 33.4 g/dL (ref 30.0–36.0)
MCV: 93.1 fL (ref 78.0–100.0)
MONOS PCT: 14 % — AB (ref 3–12)
Monocytes Absolute: 1.8 10*3/uL — ABNORMAL HIGH (ref 0.1–1.0)
NEUTROS PCT: 70 % (ref 43–77)
Neutro Abs: 9 10*3/uL — ABNORMAL HIGH (ref 1.7–7.7)
Platelets: 226 10*3/uL (ref 150–400)
RBC: 4.37 MIL/uL (ref 3.87–5.11)
RDW: 15 % (ref 11.5–15.5)
WBC: 12.9 10*3/uL — ABNORMAL HIGH (ref 4.0–10.5)

## 2015-01-20 LAB — URINALYSIS, ROUTINE W REFLEX MICROSCOPIC
Bilirubin Urine: NEGATIVE
Glucose, UA: NEGATIVE mg/dL
KETONES UR: NEGATIVE mg/dL
Nitrite: NEGATIVE
PH: 8.5 — AB (ref 5.0–8.0)
PROTEIN: 30 mg/dL — AB
Specific Gravity, Urine: 1.011 (ref 1.005–1.030)
Urobilinogen, UA: 1 mg/dL (ref 0.0–1.0)

## 2015-01-20 LAB — COMPREHENSIVE METABOLIC PANEL
ALBUMIN: 4.4 g/dL (ref 3.5–5.2)
ALK PHOS: 63 U/L (ref 39–117)
ALT: 26 U/L (ref 0–35)
AST: 27 U/L (ref 0–37)
Anion gap: 12 (ref 5–15)
BUN: 12 mg/dL (ref 6–23)
CO2: 23 mmol/L (ref 19–32)
CREATININE: 0.72 mg/dL (ref 0.50–1.10)
Calcium: 9.2 mg/dL (ref 8.4–10.5)
Chloride: 99 mmol/L (ref 96–112)
GFR calc Af Amer: >90 mL/min (ref >90)
GFR calc non Af Amer: >90 mL/min (ref >90)
GLUCOSE: 118 mg/dL — AB (ref 70–99)
POTASSIUM: 3.6 mmol/L (ref 3.5–5.1)
Sodium: 134 mmol/L — ABNORMAL LOW (ref 135–145)
TOTAL PROTEIN: 8.5 g/dL — AB (ref 6.0–8.3)
Total Bilirubin: 1 mg/dL (ref 0.3–1.2)

## 2015-01-20 LAB — WET PREP, GENITAL
Trich, Wet Prep: NONE SEEN
YEAST WET PREP: NONE SEEN

## 2015-01-20 LAB — URINE MICROSCOPIC-ADD ON

## 2015-01-20 LAB — LIPASE, BLOOD: Lipase: 20 U/L (ref 11–59)

## 2015-01-20 LAB — PREGNANCY, URINE: PREG TEST UR: NEGATIVE

## 2015-01-20 MED ORDER — PHENAZOPYRIDINE HCL 200 MG PO TABS: 200.0000 mg | ORAL_TABLET | Freq: Three times a day (TID) | ORAL | Status: AC

## 2015-01-20 MED ORDER — CEFTRIAXONE SODIUM 1 G IJ SOLR
1.0000 g | Freq: Once | INTRAMUSCULAR | Status: AC
  Administered 2015-01-20: 1 g via INTRAVENOUS
  Filled 2015-01-20: qty 10

## 2015-01-20 MED ORDER — AMOXICILLIN-POT CLAVULANATE 875-125 MG PO TABS: 1.0000 | ORAL_TABLET | Freq: Two times a day (BID) | ORAL | Status: AC

## 2015-01-20 MED ORDER — MORPHINE SULFATE 4 MG/ML IJ SOLN
4.0000 mg | Freq: Once | INTRAMUSCULAR | Status: AC
  Administered 2015-01-20: 4 mg via INTRAVENOUS
  Filled 2015-01-20: qty 1

## 2015-01-20 MED ORDER — ONDANSETRON HCL 4 MG/2ML IJ SOLN
4.0000 mg | Freq: Once | INTRAMUSCULAR | Status: AC
  Administered 2015-01-20: 4 mg via INTRAVENOUS
  Filled 2015-01-20: qty 2

## 2015-01-20 MED ORDER — KETOROLAC TROMETHAMINE 30 MG/ML IJ SOLN
30.0000 mg | Freq: Once | INTRAMUSCULAR | Status: AC
  Administered 2015-01-20: 30 mg via INTRAVENOUS
  Filled 2015-01-20: qty 1

## 2015-01-20 MED ORDER — SODIUM CHLORIDE 0.9 % IV BOLUS (SEPSIS)
1000.0000 mL | Freq: Once | INTRAVENOUS | Status: AC
Start: 2015-01-20 — End: 2015-01-20
  Administered 2015-01-20: 1000 mL via INTRAVENOUS

## 2015-01-20 MED ORDER — ONDANSETRON 4 MG PO TBDP: 4.0000 mg | ORAL_TABLET | Freq: Three times a day (TID) | ORAL | Status: AC | PRN

## 2015-01-20 MED ORDER — IBUPROFEN 800 MG PO TABS: 800.0000 mg | ORAL_TABLET | Freq: Three times a day (TID) | ORAL | Status: AC

## 2015-01-20 NOTE — Progress Notes (Signed)
pcp is [facility] [address] [place] [id number] [phone]

## 2015-01-20 NOTE — ED Notes (Signed)
Made Pfeiffer MD aware of pt's pain and request for pain meds.  No new orders at this time.
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
## 2015-01-20 NOTE — ED Notes (Signed)
Bed: WA01 Expected date:  Expected time:  Means of arrival:  Comments: EMS Abdominal Pain 

## 2015-01-20 NOTE — Discharge Instructions (Signed)
Urinary Tract Infection Urinary tract infections (UTIs) can develop anywhere along your urinary tract. Your urinary tract is your body's drainage system for removing wastes and extra water. Your urinary tract includes two kidneys, two ureters, a bladder, and a urethra. Your kidneys are a pair of bean-shaped organs. Each kidney is about the size of your fist. They are located below your ribs, one on each side of your spine. CAUSES Infections are caused by microbes, which are microscopic organisms, including fungi, viruses, and bacteria. These organisms are so small that they can only be seen through a microscope. Bacteria are the microbes that most commonly cause UTIs. SYMPTOMS  Symptoms of UTIs may vary by age and gender of the patient and by the location of the infection. Symptoms in young women typically include a frequent and intense urge to urinate and a painful, burning feeling in the bladder or urethra during urination. Older women and men are more likely to be tired, shaky, and weak and have muscle aches and abdominal pain. A fever may mean the infection is in your kidneys. Other symptoms of a kidney infection include pain in your back or sides below the ribs, nausea, and vomiting. DIAGNOSIS To diagnose a UTI, your caregiver will ask you about your symptoms. Your caregiver also will ask to provide a urine sample. The urine sample will be tested for bacteria and white blood cells. White blood cells are made by your body to help fight infection. TREATMENT  Typically, UTIs can be treated with medication. Because most UTIs are caused by a bacterial infection, they usually can be treated with the use of antibiotics. The choice of antibiotic and length of treatment depend on your symptoms and the type of bacteria causing your infection. HOME CARE INSTRUCTIONS  If you were prescribed antibiotics, take them exactly as your caregiver instructs you. Finish the medication even if you feel better after you  have only taken some of the medication.  Drink enough water and fluids to keep your urine clear or pale yellow.  Avoid caffeine, tea, and carbonated beverages. They tend to irritate your bladder.  Empty your bladder often. Avoid holding urine for long periods of time.  Empty your bladder before and after sexual intercourse.  After a bowel movement, women should cleanse from front to back. Use each tissue only once. SEEK MEDICAL CARE IF:   You have back pain.  You develop a fever.  Your symptoms do not begin to resolve within 3 days. SEEK IMMEDIATE MEDICAL CARE IF:   You have severe back pain or lower abdominal pain.  You develop chills.  You have nausea or vomiting.  You have continued burning or discomfort with urination. MAKE SURE YOU:   Understand these instructions.  Will watch your condition.  Will get help right away if you are not doing well or get worse. Document Released: 07/31/2005 Document Revised: 04/21/2012 Document Reviewed: 11/29/2011 [facility] Patient Information 2015 [place], [place]. This information is not intended to replace advice given to you by your health care provider. Make sure you discuss any questions you have with your health care provider. Back Pain, Adult Low back pain is very common. About 1 in 5 people have back pain.The cause of low back pain is rarely dangerous. The pain often gets better over time.About half of people with a sudden onset of back pain feel better in just 2 weeks. About 8 in 10 people feel better by 6 weeks.  CAUSES Some common causes of back pain include:  Strain of the muscles or ligaments supporting the spine.  Wear and tear (degeneration) of the spinal discs.  Arthritis.  Direct injury to the back. DIAGNOSIS Most of the time, the direct cause of low back pain is not known.However, back pain can be treated effectively even when the exact cause of the pain is unknown.Answering your caregiver's questions about your  overall health and symptoms is one of the most accurate ways to make sure the cause of your pain is not dangerous. If your caregiver needs more information, he or she may order lab work or imaging tests (X-rays or MRIs).However, even if imaging tests show changes in your back, this usually does not require surgery. HOME CARE INSTRUCTIONS For many people, back pain returns.Since low back pain is rarely dangerous, it is often a condition that people can learn to [facility] their own.   Remain active. It is stressful on the back to sit or stand in one place. Do not sit, drive, or stand in one place for more than 30 minutes at a time. Take short walks on level surfaces as soon as pain allows.Try to increase the length of time you walk each day.  Do not stay in bed.Resting more than 1 or 2 days can delay your recovery.  Do not avoid exercise or work.Your body is made to move.It is not dangerous to be active, even though your back may hurt.Your back will likely heal faster if you return to being active before your pain is gone.  Pay attention to your body when you bend and lift. Many people have less discomfortwhen lifting if they bend their knees, keep the load close to their bodies,and avoid twisting. Often, the most comfortable positions are those that put less stress on your recovering back.  Find a comfortable position to sleep. Use a firm mattress and lie on your side with your knees slightly bent. If you lie on your back, put a pillow under your knees.  Only take over-the-counter or prescription medicines as directed by your caregiver. Over-the-counter medicines to reduce pain and inflammation are often the most helpful.Your caregiver may prescribe muscle relaxant drugs.These medicines help dull your pain so you can more quickly return to your normal activities and healthy exercise.  Put ice on the injured area.  Put ice in a plastic bag.  Place a towel between your skin and the  bag.  Leave the ice on for 15-20 minutes, 03-04 times a day for the first 2 to 3 days. After that, ice and heat may be alternated to reduce pain and spasms.  Ask your caregiver about trying back exercises and gentle massage. This may be of some benefit.  Avoid feeling anxious or stressed.Stress increases muscle tension and can worsen back pain.It is important to recognize when you are anxious or stressed and learn ways to manage it.Exercise is a great option. SEEK MEDICAL CARE IF:  You have pain that is not relieved with rest or medicine.  You have pain that does not improve in 1 week.  You have new symptoms.  You are generally not feeling well. SEEK IMMEDIATE MEDICAL CARE IF:   You have pain that radiates from your back into your legs.  You develop new bowel or bladder control problems.  You have unusual weakness or numbness in your arms or legs.  You develop nausea or vomiting.  You develop abdominal pain.  You feel faint. Document Released: 10/21/2005 Document Revised: 04/21/2012 Document Reviewed: 02/22/2014 ExitCare Patient Information 2015 [employer]. This information is not intended to replace advice given to you by your health care provider. Make sure you discuss any questions you have with your health care provider.  Emergency Department Resource Guide 1) Find a Doctor and Pay Out of Pocket Although you won't have to find out who is covered by your insurance plan, it is a good idea to ask around and get recommendations. You will then need to call the office and see if the doctor you have chosen will accept you as a new patient and what types of options they offer for patients who are self-pay. Some doctors offer discounts or will set up payment plans for their patients who do not have insurance, but you will need to ask so you aren't surprised when you get to your appointment.  2) Contact Your Local Health Department Not all health departments have doctors that  can see patients for sick visits, but many do, so it is worth a call to see if yours does. If you don't know where your local health department is, you can check in your phone book. The CDC also has a tool to help you locate your state's health department, and many state websites also have listings of all of their local health departments.  3) Find a Walk-in Clinic If your illness is not likely to be very severe or complicated, you may want to try a walk in clinic. These are popping up all over the country in pharmacies, drugstores, and shopping centers. They're usually staffed by nurse practitioners or physician assistants that have been trained to treat common illnesses and complaints. They're usually fairly quick and inexpensive. However, if you have serious medical issues or chronic medical problems, these are probably not your best option.  No Primary Care Doctor: - Call Health Connect at  [phone] - they can help you locate a primary care doctor that  accepts your insurance, provides certain services, etc. - Physician Referral Service- [phone]  Chronic Pain Problems: Organization         Address  Phone   Notes  [facility]  [phone] Patients need to be referred by their primary care doctor.   Medication Assistance: Organization         Address  Phone   Notes  [facility] Medication [facility] [address] [place], [place] [address] [phone] --Must be a resident of [facility] -- Must have NO insurance coverage whatsoever (no Medicaid/ Medicare, etc.) -- The pt. MUST have a primary care doctor that directs their care regularly and follows them in the community   [facility]  [phone]   [employer]  [phone]    Agencies that provide inexpensive medical care: Organization         Address  Phone   Notes  [facility]  [phone]   [facility]    [phone]   [facility] [address] [place], [place] [address] [phone]   [facility] [address] [place] [place] [phone]   Planned Parenthood    [phone]   [facility]    [phone]   [facility]  [address], [place] Phone:  [phone], Fax:  [phone] Hours of Operation:  9 am - 6 pm, M-F.  Also accepts Medicaid/Medicare and self-pay.  [facility]  [address], [place] Phone: [phone](336) [phone], Fax: [phone]. Hours of Operation:  8:30 am - 5:30 pm, M-F.  Also accepts Medicaid and self-pay.  [facility] [place] [address], [place] Phone: [phone]) [phone]   [facility] [address], [place], [place] [phone], Ext. [phone] Mondays & Thursdays: 7-9 AM.  First 15 patients are seen on a first come, first serve basis.    Medicaid-accepting [facility] Providers:  Organization         Address  Phone   Notes  [facility] [address], [place] [phone]) [phone] Also accepts self-pay patients.  [facility] [address], [place]  [phone]) [phone]   [facility] [address], [place] [phone]) [phone]   [facility] [address] [place] [address], [place] [phone]) [phone]   Renaye RakersVeita Bland [address], [place]   [phone]) [phone] Only accepts [place] Access [place] patients after they have their name applied to their card.   Self-Pay (no insurance) in [facility] [place]:  Organization         Address  Phone   Notes  Sickle Cell Patients, [facility] [address], [place] [phone]) [phone]   [facility] [address], [place] [phone]) [phone]   [facility]  [address], [place] [phone]) [phone]   [facility]/Dr.  Osei-Bonsu  [address] [place] [address], [place] or [phone], [place] [phone]) [phone] Phone number for both [place] and [place] locations is the same.  [facility] [address], [place] [phone]   [facility]  [address] [place] [address], [place] or [address] [phone]) [phone]   [facility] [address], [place] [phone]) [phone], phone; [phone](336) [phone], fax Sees patients 1st and 3rd Saturday of every month.  Must not qualify for public or private insurance (i.e. Medicaid, Medicare, [place] Health Choice, Veterans' Benefits)  Household income should be no more than 200% of the poverty level The clinic cannot treat you if you are pregnant or think you are pregnant  Sexually transmitted diseases are not treated at the clinic.    Dental Care: Organization         Address  Phone  Notes  [facility] Department of [facility] [address], [place] [phone]) [phone] Accepts children up to age 32 who are enrolled in [place] or [place] Health Choice; pregnant women with a Medicaid card; and children who have applied for Medicaid or [place] Health Choice, but were declined, whose parents can pay a reduced fee at time of service.  [facility] of [facility] [place]  [address], [place] [phone]) [phone] Accepts children up to age 32 who are enrolled in [place] or [place] Health Choice; pregnant women with a Medicaid card; and children who have applied for Medicaid or [place] Health Choice, but were declined, whose parents can pay a reduced fee at time of service.  [facility]  [address], [place] [phone] Patients are seen by appointment only. Walk-ins are not accepted. [facility] will see patients 10418 years of age and older. Monday - Tuesday (8am-5pm) Most Wednesdays (8:30-5pm) $30 per visit, cash only  [facility]  [employer] [facility] Access  PROGRAM  [address], [facility] [phone] Patients are seen by appointment only. Walk-ins are not accepted. [facility] will see patients 54 years of age and older. One Wednesday Evening (Monthly: Volunteer Based).  $30 per visit, cash only  [employer]  [phone] for adults; Children under age 36, call Graduate Pediatric Dentistry at [phone]. Children aged 75-14, please call [phone] to request a pediatric application.  Dental services are provided in all areas of dental care including fillings, crowns and bridges, complete and partial dentures, implants, gum treatment, root canals, and extractions. Preventive care is also provided. Treatment is provided to both adults and children. Patients are selected via a lottery and there is often a waiting list.   [facility] [address] [place]  [phone] [email or web address]   [facility] [address] [place], [place] [phone], Ext. [phone] Second and Fourth Thursday of each month, opens at 6:30 AM; Clinic ends at 9 AM.  Patients are seen on a first-come first-served basis, and a limited number are seen during each clinic.   [facility]  [address] [place], [place] [phone]   Eligibility Requirements You must have lived in [place], [place], or [place] counties for at least the last three months.   You cannot be eligible for state or federal sponsored [place], including [employer], [place], or [employer].   You generally cannot be eligible for healthcare insurance through your employer.    How to apply: Eligibility screenings are held every Tuesday and Wednesday afternoon from 1:00 pm until 4:00 pm. You do not need an appointment for the interview!  [facility] [address], [place], [place] [phone]   [facility] Health Department  [phone]   [facility]  Health Department  [phone]   [facility] Health Department  [phone]    Behavioral Health Resources in the Community: Intensive Outpatient Programs Organization         Address  Phone  Notes  [facility] [address] [place], [place] [phone]   [facility] Outpatient [address] [place], [place] [phone]   [facility] [address] [place], [place]  [phone]   [facility] [address],  [place], [place] [phone] or [phone]   Substance Abuse Resources Organization         Address  Phone  Notes  [facility] Drug Services  [phone]   [facility]  [phone]   [facility] [place]  [phone]   Floydene Flock  [phone]   Residential & Outpatient Substance Abuse Program  [phone]   Psychological Services Organization         Address  Phone  Notes  [facility]  [phone]   [facility]  [phone]   [facility] [address] [place] [address], [place] [phone] or [phone]    Mobile Crisis Teams Organization         Address  Phone  Notes  [facility], Mobile Crisis Care Unit  [phone]   [facility]  [address] [place], [place] [phone]   Doristine Locks [address] [place] [phone]    Self-Help/Support Groups Organization         Address  Phone             Notes  Mental Health Assoc. of  [place] - variety of support groups  336- I7437963 Call for more information  Narcotics Anonymous (NA), [facility] [address], [employer] [place]  2 meetings at this location   Statistician         Address  Phone  Notes  [facility] [address],    [place]  [phone]   [facility]  [address], [place] [address], [place], [place] [phone]   [facility] Treatment  Facility [address] [place], [place] [phone] Admissions: 8am-3pm M-F  [facility] [address]    [place], [place] [phone]   [facility] [address] [place], [place], [place] [phone]   [facility] [address],  [place], [place] [phone]   [facility] - Intensive Outpatient [address], [place], [place] [phone]   [facility] (Addiction Recovery [facility] [address] [place]  [place], [place] [phone] or [phone]   [facility] ([facility]) [address], [place], [place] [phone] Accepts Medicaid  [facility] [address],  [place] [phone] Substance Abuse/Addiction Treatment   [facility] Organization         Address  Phone  Notes  [facility]  [phone]   Angie Fava, PhD [address] [place], [place]   [phone] or [phone]   [facility] Behavioral   [address] [place], [place] [phone]   [facility] [address] [place], [place] [phone] Insurance/Medicaid/sponsorship through [facility] and Families [address]                                    [place], [place] [phone] Therapy/tele-psych/case  [facility] [address], [place] [phone]    Dr. Lolly Mustache  [phone]   [facility]  [employer] [facility] Dept. 1) [address] [place] 2) [address] [place] 3)  [address], [place] [phone]  [phone]   [facility] Child Abuse Hotline 2263863723 or [phone] (After Hours)

## 2015-01-20 NOTE — ED Notes (Signed)
Pt comes in by PTAR, for right lower back pain that radiates down her right leg x 4 days. Pt states that pain is now constant. Pt been taking ibuprofen for the pain.

## 2015-01-21 LAB — HIV ANTIBODY (ROUTINE TESTING W REFLEX): HIV Screen 4th Generation wRfx: NONREACTIVE

## 2015-01-23 LAB — URINE CULTURE

## 2015-01-23 LAB — GC/CHLAMYDIA PROBE AMP (~~LOC~~) NOT AT ARMC
CHLAMYDIA, DNA PROBE: NEGATIVE
NEISSERIA GONORRHEA: NEGATIVE

## 2015-01-24 ENCOUNTER — Telehealth (HOSPITAL_BASED_OUTPATIENT_CLINIC_OR_DEPARTMENT_OTHER): Payer: Self-pay | Admitting: Emergency Medicine

## 2015-01-24 NOTE — Progress Notes (Signed)
ED Antimicrobial Stewardship Positive Culture Follow Up   Tara Hayes is an 34 y.o. female who presented to [facility] on 01/20/2015 with a chief complaint of  Chief Complaint  Patient presents with  . Back Pain    Recent Results (from the past 720 hour(s))  Culture, blood (routine x 2)     Status: None (Preliminary result)   Collection Time: 01/20/15 10:34 AM  Result Value Ref Range Status   Specimen Description BLOOD RIGHT FOREARM  Final   Special Requests BOTTLES DRAWN AEROBIC AND ANAEROBIC 4ML  Final   Culture   Final           BLOOD CULTURE RECEIVED NO GROWTH TO DATE CULTURE WILL BE HELD FOR 5 DAYS BEFORE ISSUING A FINAL NEGATIVE REPORT Performed at [facility]    Report Status PENDING  Incomplete  Urine culture     Status: None   Collection Time: 01/20/15 10:47 AM  Result Value Ref Range Status   Specimen Description URINE, CLEAN CATCH  Final   Special Requests NONE  Final   Colony Count   Final    >=100,000 COLONIES/ML Performed at [facility]    Culture   Final    ESCHERICHIA COLI Performed at [facility]    Report Status 01/23/2015 FINAL  Final   Organism ID, Bacteria ESCHERICHIA COLI  Final      Susceptibility   Escherichia coli - MIC*    AMPICILLIN >=32 RESISTANT Resistant     CEFAZOLIN <=4 SENSITIVE Sensitive     CEFTRIAXONE <=1 SENSITIVE Sensitive     CIPROFLOXACIN <=0.25 SENSITIVE Sensitive     GENTAMICIN <=1 SENSITIVE Sensitive     LEVOFLOXACIN <=0.12 SENSITIVE Sensitive     NITROFURANTOIN <=16 SENSITIVE Sensitive     TOBRAMYCIN <=1 SENSITIVE Sensitive     TRIMETH/SULFA >=320 RESISTANT Resistant     PIP/TAZO <=4 SENSITIVE Sensitive     * ESCHERICHIA COLI  Culture, blood (routine x 2)     Status: None (Preliminary result)   Collection Time: 01/20/15 11:16 AM  Result Value Ref Range Status   Specimen Description BLOOD LEFT HAND  Final   Special Requests BOTTLES DRAWN AEROBIC ONLY 10CC  Final   Culture   Final   BLOOD CULTURE RECEIVED NO GROWTH TO DATE CULTURE WILL BE HELD FOR 5 DAYS BEFORE ISSUING A FINAL NEGATIVE REPORT Performed at [facility]    Report Status PENDING  Incomplete  Wet prep, genital     Status: Abnormal   Collection Time: 01/20/15  1:25 PM  Result Value Ref Range Status   Yeast Wet Prep HPF POC NONE SEEN NONE SEEN Final   Trich, Wet Prep NONE SEEN NONE SEEN Final   Clue Cells Wet Prep HPF POC FEW (A) NONE SEEN Final   WBC, Wet Prep HPF POC MODERATE (A) NONE SEEN Final    [x]  Treated with Augmentin, organism resistant to prescribed antimicrobial  New antibiotic prescription: Cephalexin 500mg  PO BID x 7 days Patient should stop Augmentin  ED Provider: Emilia BeckKaitlyn Szekalski, PA-C   Sallee Provencalurner, Liberato Stansbery S 01/24/2015, 9:44 AM Infectious Diseases Pharmacist Phone# [phone]

## 2015-01-25 ENCOUNTER — Telehealth (HOSPITAL_BASED_OUTPATIENT_CLINIC_OR_DEPARTMENT_OTHER): Payer: Self-pay | Admitting: Emergency Medicine

## 2015-01-25 NOTE — Telephone Encounter (Signed)
Post ED Visit - Positive Culture Follow-up: Successful Patient Follow-Up  Culture assessed and recommendations reviewed by: []  Wes Dulaney, Pharm.D., BCPS []  Celedonio MiyamotoJeremy Frens, Pharm.D., BCPS []  Georgina PillionElizabeth Martin, [address]Pharm[address]D., BCPS []  PhiladelphiaMinh Pham, Vermont[place]arm.D., BCPS, AAHIVP [x]  Estella HuskMichelle Turner, Pharm.D., BCPS, AAHIVP []  Red ChristiansSamson Lee, Pharm.D. []  Tennis Mustassie Stewart, Pharm.D.  Positive urine culture  []  Patient discharged without antimicrobial prescription and treatment is now indicated [x]  Organism is resistant to prescribed ED discharge antimicrobial []  Patient with positive blood cultures  Changes discussed with ED provider: Emilia BeckKaitlyn Szekalski, PA New antibiotic prescription: Keflex 500 mg PO BID x seven days, stop Augmentin [phone]  Contacted patient, date 01/25/15 , time 1249 pt notified of positive urine culture  and need for change in antibiotic. RX  Keflex called to [facility] [phone].    Jiles HaroldGammons, Chrissa Meetze Chaney 01/25/2015, 1:08 PM

## 2015-01-26 LAB — CULTURE, BLOOD (ROUTINE X 2)
Culture: NO GROWTH
Culture: NO GROWTH

## 2016-03-09 ENCOUNTER — Encounter (HOSPITAL_COMMUNITY): Payer: Self-pay | Admitting: *Deleted

## 2016-03-09 ENCOUNTER — Emergency Department (HOSPITAL_COMMUNITY)
Admission: EM | Admit: 2016-03-09 | Discharge: 2016-03-09 | Disposition: A | Discharge status: Home / Self Care | Payer: Medicaid Other | Attending: Emergency Medicine | Admitting: Emergency Medicine

## 2016-03-09 DIAGNOSIS — N390 Urinary tract infection, site not specified: Secondary | ICD-10-CM

## 2016-03-09 DIAGNOSIS — Z91040 Latex allergy status: Secondary | ICD-10-CM | POA: Insufficient documentation

## 2016-03-09 DIAGNOSIS — R1084 Generalized abdominal pain: Secondary | ICD-10-CM

## 2016-03-09 DIAGNOSIS — E876 Hypokalemia: Secondary | ICD-10-CM

## 2016-03-09 DIAGNOSIS — Z791 Long term (current) use of non-steroidal anti-inflammatories (NSAID): Secondary | ICD-10-CM | POA: Diagnosis not present

## 2016-03-09 DIAGNOSIS — F17200 Nicotine dependence, unspecified, uncomplicated: Secondary | ICD-10-CM | POA: Insufficient documentation

## 2016-03-09 DIAGNOSIS — R52 Pain, unspecified: Secondary | ICD-10-CM

## 2016-03-09 DIAGNOSIS — D72829 Elevated white blood cell count, unspecified: Secondary | ICD-10-CM | POA: Diagnosis not present

## 2016-03-09 DIAGNOSIS — M791 Myalgia: Secondary | ICD-10-CM | POA: Insufficient documentation

## 2016-03-09 DIAGNOSIS — F329 Major depressive disorder, single episode, unspecified: Secondary | ICD-10-CM | POA: Insufficient documentation

## 2016-03-09 DIAGNOSIS — Z792 Long term (current) use of antibiotics: Secondary | ICD-10-CM | POA: Diagnosis not present

## 2016-03-09 DIAGNOSIS — Z79899 Other long term (current) drug therapy: Secondary | ICD-10-CM | POA: Diagnosis not present

## 2016-03-09 DIAGNOSIS — K219 Gastro-esophageal reflux disease without esophagitis: Secondary | ICD-10-CM | POA: Insufficient documentation

## 2016-03-09 DIAGNOSIS — R509 Fever, unspecified: Secondary | ICD-10-CM

## 2016-03-09 DIAGNOSIS — R112 Nausea with vomiting, unspecified: Secondary | ICD-10-CM | POA: Diagnosis present

## 2016-03-09 LAB — CBC WITH DIFFERENTIAL/PLATELET
Basophils Absolute: 0 10*3/uL (ref 0.0–0.1)
Basophils Relative: 0 %
EOS ABS: 0 10*3/uL (ref 0.0–0.7)
EOS PCT: 0 %
HCT: 38.9 % (ref 36.0–46.0)
Hemoglobin: 13.1 g/dL (ref 12.0–15.0)
LYMPHS ABS: 1.7 10*3/uL (ref 0.7–4.0)
Lymphocytes Relative: 13 %
MCH: 31.8 pg (ref 26.0–34.0)
MCHC: 33.7 g/dL (ref 30.0–36.0)
MCV: 94.4 fL (ref 78.0–100.0)
MONO ABS: 1.4 10*3/uL — AB (ref 0.1–1.0)
MONOS PCT: 11 %
Neutro Abs: 9.2 10*3/uL — ABNORMAL HIGH (ref 1.7–7.7)
Neutrophils Relative %: 76 %
PLATELETS: 204 10*3/uL (ref 150–400)
RBC: 4.12 MIL/uL (ref 3.87–5.11)
RDW: 15 % (ref 11.5–15.5)
WBC: 12.3 10*3/uL — AB (ref 4.0–10.5)

## 2016-03-09 LAB — URINALYSIS, ROUTINE W REFLEX MICROSCOPIC
BILIRUBIN URINE: NEGATIVE
Glucose, UA: NEGATIVE mg/dL
KETONES UR: 15 mg/dL — AB
Nitrite: NEGATIVE
PROTEIN: 30 mg/dL — AB
Specific Gravity, Urine: 1.015 (ref 1.005–1.030)
pH: 7 (ref 5.0–8.0)

## 2016-03-09 LAB — COMPREHENSIVE METABOLIC PANEL
ALT: 17 U/L (ref 14–54)
AST: 20 U/L (ref 15–41)
Albumin: 4 g/dL (ref 3.5–5.0)
Alkaline Phosphatase: 51 U/L (ref 38–126)
Anion gap: 13 (ref 5–15)
BILIRUBIN TOTAL: 1.5 mg/dL — AB (ref 0.3–1.2)
BUN: 7 mg/dL (ref 6–20)
CHLORIDE: 105 mmol/L (ref 101–111)
CO2: 22 mmol/L (ref 22–32)
CREATININE: 0.84 mg/dL (ref 0.44–1.00)
Calcium: 8.8 mg/dL — ABNORMAL LOW (ref 8.9–10.3)
GFR calc Af Amer: >60 mL/min (ref >60)
Glucose, Bld: 116 mg/dL — ABNORMAL HIGH (ref 65–99)
Potassium: 3 mmol/L — ABNORMAL LOW (ref 3.5–5.1)
Sodium: 140 mmol/L (ref 135–145)
Total Protein: 7.4 g/dL (ref 6.5–8.1)

## 2016-03-09 LAB — LIPASE, BLOOD: LIPASE: 17 U/L (ref 11–51)

## 2016-03-09 LAB — URINE MICROSCOPIC-ADD ON

## 2016-03-09 LAB — I-STAT CG4 LACTIC ACID, ED: LACTIC ACID, VENOUS: 2 mmol/L (ref 0.5–2.0)

## 2016-03-09 LAB — I-STAT BETA HCG BLOOD, ED (MC, WL, AP ONLY): I-stat hCG, quantitative: <5 m[IU]/mL (ref <5)

## 2016-03-09 MED ORDER — SODIUM CHLORIDE 0.9 % IV SOLN
1000.0000 mL | INTRAVENOUS | Status: DC
  Administered 2016-03-09: 1000 mL via INTRAVENOUS

## 2016-03-09 MED ORDER — ACETAMINOPHEN 325 MG PO TABS
650.0000 mg | ORAL_TABLET | Freq: Once | ORAL | Status: AC
  Administered 2016-03-09: 650 mg via ORAL
  Filled 2016-03-09: qty 2

## 2016-03-09 MED ORDER — PROMETHAZINE HCL 25 MG PO TABS: 25.0000 mg | ORAL_TABLET | Freq: Four times a day (QID) | ORAL | Status: AC | PRN

## 2016-03-09 MED ORDER — METOCLOPRAMIDE HCL 5 MG/ML IJ SOLN
10.0000 mg | Freq: Once | INTRAMUSCULAR | Status: AC
  Administered 2016-03-09: 10 mg via INTRAVENOUS
  Filled 2016-03-09: qty 2

## 2016-03-09 MED ORDER — POTASSIUM CHLORIDE CRYS ER 20 MEQ PO TBCR
60.0000 meq | EXTENDED_RELEASE_TABLET | Freq: Once | ORAL | Status: AC
  Administered 2016-03-09: 60 meq via ORAL
  Filled 2016-03-09: qty 3

## 2016-03-09 MED ORDER — ONDANSETRON HCL 8 MG PO TABS: 8.0000 mg | ORAL_TABLET | Freq: Three times a day (TID) | ORAL | Status: AC | PRN

## 2016-03-09 MED ORDER — IBUPROFEN 200 MG PO TABS
600.0000 mg | ORAL_TABLET | Freq: Once | ORAL | Status: AC
  Administered 2016-03-09: 600 mg via ORAL
  Filled 2016-03-09: qty 3

## 2016-03-09 MED ORDER — SODIUM CHLORIDE 0.9 % IV BOLUS (SEPSIS)
1000.0000 mL | Freq: Once | INTRAVENOUS | Status: AC
  Administered 2016-03-09: 1000 mL via INTRAVENOUS

## 2016-03-09 MED ORDER — CEPHALEXIN 500 MG PO CAPS: ORAL_CAPSULE | ORAL | Status: DC

## 2016-03-09 MED ORDER — CEFTRIAXONE SODIUM 1 G IJ SOLR
1.0000 g | Freq: Once | INTRAMUSCULAR | Status: AC
  Administered 2016-03-09: 1 g via INTRAVENOUS
  Filled 2016-03-09: qty 10
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
## 2016-03-09 NOTE — ED Notes (Signed)
Bed: [id number] Expected date:  Expected time:  Means of arrival:  Comments: EMS- 34yo N/V

## 2016-03-09 NOTE — ED Notes (Signed)
Pt. Is unable to urinate at this time.  

## 2016-03-09 NOTE — ED Notes (Signed)
Pt reports n/v and generalized body aches x 2 days.  Pt reports she has been vomiting all night.  Pt is crying and hyperventilating at present.  States "please I just want an IV and go to sleep."  Pt has hx of anxiety.  Pt keep saying "'I'm so cold."  Appears very anxious.

## 2016-03-09 NOTE — Discharge Instructions (Signed)
Stay very well hydrated with plenty of water throughout the day. Use phenergan and/or zofran as needed for nausea. Take antibiotic until completed. Use tylenol or motrin as needed for pain or fevers. Follow up with your primary care physician listed on your medicaid card in 1 week for recheck of ongoing symptoms but return to ER for emergent changing or worsening of symptoms. Please seek immediate care if you develop the following: You develop back pain.  Your symptoms are no better, or worse in 3 days. There is severe back pain or lower abdominal pain.  You develop chills.  You have a fever.  There is nausea or vomiting.  There is continued burning or discomfort with urination.    Urinary Tract Infection A urinary tract infection (UTI) can occur any place along the urinary tract. The tract includes the kidneys, ureters, bladder, and urethra. A type of germ called bacteria often causes a UTI. UTIs are often helped with antibiotic medicine.  HOME CARE   If given, take antibiotics as told by your doctor. Finish them even if you start to feel better.  Drink enough fluids to keep your pee (urine) clear or pale yellow.  Avoid tea, drinks with caffeine, and bubbly (carbonated) drinks.  Pee often. Avoid holding your pee in for a long time.  Pee before and after having sex (intercourse).  Wipe from front to back after you poop (bowel movement) if you are a woman. Use each tissue only once. GET HELP RIGHT AWAY IF:   You have back pain.  You have lower belly (abdominal) pain.  You have chills.  You feel sick to your stomach (nauseous).  You throw up (vomit).  Your burning or discomfort with peeing does not go away.  You have a fever.  Your symptoms are not better in 3 days. MAKE SURE YOU:   Understand these instructions.  Will watch your condition.  Will get help right away if you are not doing well or get worse.   This information is not intended to replace advice given to  you by your health care provider. Make sure you discuss any questions you have with your health care provider.   Document Released: 04/08/2008 Document Revised: 11/11/2014 Document Reviewed: 05/21/2012 Elsevier Interactive Patient Education 2016 [employer] Inc[employer]  Nausea and Vomiting Nausea means you feel sick to your stomach. Throwing up (vomiting) is a reflex where stomach contents come out of your mouth. HOME CARE   Take medicine as told by your doctor.  Do not force yourself to eat. However, you do need to drink fluids.  If you feel like eating, eat a normal diet as told by your doctor.  Eat rice, wheat, potatoes, bread, lean meats, yogurt, fruits, and vegetables.  Avoid high-fat foods.  Drink enough fluids to keep your pee (urine) clear or pale yellow.  Ask your doctor how to replace body fluid losses (rehydrate). Signs of body fluid loss (dehydration) include:  Feeling very thirsty.  Dry lips and mouth.  Feeling dizzy.  Dark pee.  Peeing less than normal.  Feeling confused.  Fast breathing or heart rate. GET HELP RIGHT AWAY IF:   You have blood in your throw up.  You have black or bloody poop (stool).  You have a bad headache or stiff neck.  You feel confused.  You have bad belly (abdominal) pain.  You have chest pain or trouble breathing.  You do not pee at least once every 8 hours.  You have cold, clammy skin.  You keep throwing up after 24 to 48 hours.  You have a fever. MAKE SURE YOU:   Understand these instructions.  Will watch your condition.  Will get help right away if you are not doing well or get worse.   This information is not intended to replace advice given to you by your health care provider. Make sure you discuss any questions you have with your health care provider.   Document Released: 04/08/2008 Document Revised: 01/13/2012 Document Reviewed: 03/22/2011 Elsevier Interactive Patient Education 2016 [employer] Inc.  Fever,  Adult A fever is an increase in the body's temperature. It is often defined as a temperature of 100 F (38C) or higher. Short mild or moderate fevers often have no long-term effects. They also often do not need treatment. Moderate or high fevers may make you feel uncomfortable. Sometimes, they can also be a sign of a serious illness or disease. The sweating that may happen with repeated fevers or fevers that last a while may also cause you to not have enough fluid in your body (dehydration). You can take your temperature with a thermometer to see if you have a fever. A measured temperature can change with:  Age.  Time of day.  Where the thermometer is placed:  Mouth (oral).  Rectum (rectal).  Ear (tympanic).  Underarm (axillary).  Forehead (temporal). HOME CARE Pay attention to any changes in your symptoms. Take these actions to help with your condition:  Take over-the-counter and prescription medicines only as told by your doctor. Follow the dosing instructions carefully.  If you were prescribed an antibiotic medicine, take it as told by your doctor. Do not stop taking the antibiotic even if you start to feel better.  Rest as needed.  Drink enough fluid to keep your pee (urine) clear or pale yellow.  Sponge yourself or bathe with room-temperature water as needed. This helps to lower your body temperature . Do not use ice water.  Do not wear too many blankets or heavy clothes. GET HELP IF:  You throw up (vomit).  You cannot eat or drink without throwing up.  You have watery poop (diarrhea).  It hurts when you pee.  Your symptoms do not get better with treatment.  You have new symptoms.  You feel very weak. GET HELP RIGHT AWAY IF:  You are short of breath or have trouble breathing.  You are dizzy or you pass out (faint).  You feel confused.  You have signs of not having enough fluid in your body, such as:  A dry mouth.  Peeing less.  Looking pale.  You  have very bad pain in your belly (abdomen).  You keep throwing up or having water poop.  You have a skin rash.  Your symptoms suddenly get worse.   This information is not intended to replace advice given to you by your health care provider. Make sure you discuss any questions you have with your health care provider.   Document Released: 07/30/2008 Document Revised: 07/12/2015 Document Reviewed: 12/15/2014 Elsevier Interactive Patient Education 2016 [employer]  Abdominal Pain, Adult Many things can cause belly (abdominal) pain. Most times, the belly pain is not dangerous. Many cases of belly pain can be watched and treated at home. HOME CARE   Do not take medicines that help you go poop (laxatives) unless told to by your doctor.  Only take medicine as told by your doctor.  Eat or drink as told by your doctor. Your doctor will tell you if  you should be on a special diet. GET HELP IF:  You do not know what is causing your belly pain.  You have belly pain while you are sick to your stomach (nauseous) or have runny poop (diarrhea).  You have pain while you pee or poop.  Your belly pain wakes you up at night.  You have belly pain that gets worse or better when you eat.  You have belly pain that gets worse when you eat fatty foods.  You have a fever. GET HELP RIGHT AWAY IF:   The pain does not go away within 2 hours.  You keep throwing up (vomiting).  The pain changes and is only in the right or left part of the belly.  You have bloody or tarry looking poop. MAKE SURE YOU:   Understand these instructions.  Will watch your condition.  Will get help right away if you are not doing well or get worse.   This information is not intended to replace advice given to you by your health care provider. Make sure you discuss any questions you have with your health care provider.   Document Released: 04/08/2008 Document Revised: 11/11/2014 Document Reviewed:  06/30/2013 Elsevier Interactive Patient Education [employer]

## 2016-03-10 ENCOUNTER — Encounter (HOSPITAL_COMMUNITY): Payer: Self-pay | Admitting: Emergency Medicine

## 2016-03-10 ENCOUNTER — Emergency Department (HOSPITAL_COMMUNITY)
Admission: EM | Admit: 2016-03-10 | Discharge: 2016-03-10 | Disposition: A | Discharge status: Home / Self Care | Payer: Medicaid Other | Attending: Emergency Medicine | Admitting: Emergency Medicine

## 2016-03-10 ENCOUNTER — Emergency Department (HOSPITAL_COMMUNITY): Payer: Medicaid Other

## 2016-03-10 ENCOUNTER — Telehealth (HOSPITAL_BASED_OUTPATIENT_CLINIC_OR_DEPARTMENT_OTHER): Payer: Self-pay

## 2016-03-10 DIAGNOSIS — R109 Unspecified abdominal pain: Secondary | ICD-10-CM

## 2016-03-10 DIAGNOSIS — F17200 Nicotine dependence, unspecified, uncomplicated: Secondary | ICD-10-CM | POA: Insufficient documentation

## 2016-03-10 DIAGNOSIS — R1011 Right upper quadrant pain: Secondary | ICD-10-CM | POA: Insufficient documentation

## 2016-03-10 DIAGNOSIS — R197 Diarrhea, unspecified: Secondary | ICD-10-CM | POA: Diagnosis not present

## 2016-03-10 DIAGNOSIS — Z791 Long term (current) use of non-steroidal anti-inflammatories (NSAID): Secondary | ICD-10-CM | POA: Diagnosis not present

## 2016-03-10 DIAGNOSIS — K219 Gastro-esophageal reflux disease without esophagitis: Secondary | ICD-10-CM | POA: Insufficient documentation

## 2016-03-10 DIAGNOSIS — Z91040 Latex allergy status: Secondary | ICD-10-CM | POA: Insufficient documentation

## 2016-03-10 DIAGNOSIS — Z792 Long term (current) use of antibiotics: Secondary | ICD-10-CM | POA: Diagnosis not present

## 2016-03-10 DIAGNOSIS — Z79899 Other long term (current) drug therapy: Secondary | ICD-10-CM | POA: Insufficient documentation

## 2016-03-10 LAB — URINE MICROSCOPIC-ADD ON
BACTERIA UA: NONE SEEN
WBC UA: NONE SEEN WBC/hpf (ref 0–5)

## 2016-03-10 LAB — COMPREHENSIVE METABOLIC PANEL
ALT: 21 U/L (ref 14–54)
ANION GAP: 12 (ref 5–15)
AST: 22 U/L (ref 15–41)
Albumin: 3.7 g/dL (ref 3.5–5.0)
Alkaline Phosphatase: 61 U/L (ref 38–126)
BILIRUBIN TOTAL: 0.9 mg/dL (ref 0.3–1.2)
BUN: 5 mg/dL — AB (ref 6–20)
CHLORIDE: 107 mmol/L (ref 101–111)
CO2: 19 mmol/L — ABNORMAL LOW (ref 22–32)
Calcium: 8.8 mg/dL — ABNORMAL LOW (ref 8.9–10.3)
Creatinine, Ser: 0.67 mg/dL (ref 0.44–1.00)
GFR calc Af Amer: >60 mL/min (ref >60)
Glucose, Bld: 101 mg/dL — ABNORMAL HIGH (ref 65–99)
POTASSIUM: 3.4 mmol/L — AB (ref 3.5–5.1)
Sodium: 138 mmol/L (ref 135–145)
TOTAL PROTEIN: 6.9 g/dL (ref 6.5–8.1)

## 2016-03-10 LAB — URINALYSIS, ROUTINE W REFLEX MICROSCOPIC
Bilirubin Urine: NEGATIVE
GLUCOSE, UA: NEGATIVE mg/dL
KETONES UR: NEGATIVE mg/dL
LEUKOCYTES UA: NEGATIVE
NITRITE: NEGATIVE
PH: 7 (ref 5.0–8.0)
Protein, ur: NEGATIVE mg/dL
SPECIFIC GRAVITY, URINE: 1.003 — AB (ref 1.005–1.030)

## 2016-03-10 LAB — CBC
HEMATOCRIT: 34.6 % — AB (ref 36.0–46.0)
HEMOGLOBIN: 11.9 g/dL — AB (ref 12.0–15.0)
MCH: 31.4 pg (ref 26.0–34.0)
MCHC: 34.4 g/dL (ref 30.0–36.0)
MCV: 91.3 fL (ref 78.0–100.0)
Platelets: 212 10*3/uL (ref 150–400)
RBC: 3.79 MIL/uL — AB (ref 3.87–5.11)
RDW: 14.8 % (ref 11.5–15.5)
WBC: 9.2 10*3/uL (ref 4.0–10.5)

## 2016-03-10 LAB — LIPASE, BLOOD: LIPASE: 19 U/L (ref 11–51)

## 2016-03-10 MED ORDER — METRONIDAZOLE 500 MG PO TABS: 500.0000 mg | ORAL_TABLET | Freq: Two times a day (BID) | ORAL | Status: AC

## 2016-03-10 MED ORDER — ONDANSETRON HCL 4 MG/2ML IJ SOLN
4.0000 mg | Freq: Once | INTRAMUSCULAR | Status: AC
  Administered 2016-03-10: 4 mg via INTRAVENOUS
  Filled 2016-03-10: qty 2

## 2016-03-10 MED ORDER — LOPERAMIDE HCL 2 MG PO CAPS: 4.0000 mg | ORAL_CAPSULE | Freq: Every evening | ORAL | Status: AC | PRN

## 2016-03-10 MED ORDER — CIPROFLOXACIN HCL 500 MG PO TABS: 500.0000 mg | ORAL_TABLET | Freq: Two times a day (BID) | ORAL | Status: AC

## 2016-03-10 MED ORDER — HYDROCODONE-ACETAMINOPHEN 5-325 MG PO TABS: 1.0000 | ORAL_TABLET | Freq: Once | ORAL | Status: DC

## 2016-03-10 MED ORDER — MORPHINE SULFATE (PF) 4 MG/ML IV SOLN
4.0000 mg | Freq: Once | INTRAVENOUS | Status: AC
  Administered 2016-03-10: 4 mg via INTRAVENOUS
  Filled 2016-03-10: qty 1

## 2016-03-10 MED ORDER — OXYCODONE-ACETAMINOPHEN 5-325 MG PO TABS
2.0000 | ORAL_TABLET | Freq: Once | ORAL | Status: AC
  Administered 2016-03-10: 2 via ORAL
  Filled 2016-03-10: qty 2

## 2016-03-10 NOTE — ED Notes (Signed)
Dr. Rhunette CroftNanavati made aware of pt's request to be seen by him and outcome of pain medication.
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
## 2016-03-10 NOTE — ED Notes (Signed)
Pt given discharge instructions and expressed understanding.

## 2016-03-10 NOTE — ED Notes (Signed)
Pt states she would like to have the pain medication and nausea medication that Dr. Rhunette CroftNanavati offered her earlier now.  MD made aware.

## 2016-03-10 NOTE — ED Notes (Signed)
Pt actively heaving

## 2016-03-10 NOTE — ED Notes (Addendum)
Patient was seen here yesterday with 103 temp, diagnosed with UTI. Patient reports being up all night with right lower abdominal pain, nausea, vomiting, and diarrhea. Patient reports "red jelly" in diarrhea last night. Patient afebrile today.

## 2016-03-10 NOTE — Discharge Instructions (Signed)
We saw you in the ER for the abdominal pain, diarrhea. All of our results are normal, including all labs and imaging. Kidney function is fine as well. We are not sure what is causing your abdominal pain, and recommend that you see your primary care doctor within 2-3 days for further evaluation. If your symptoms get worse, return to the ER. STOP KEFLEX, START CIPRO AND FLAGYL INSTEAD.  Please return to the ER if your symptoms worsen; you have increased pain, fevers, chills, inability to keep any medications down, confusion. Otherwise see the outpatient doctor as requested.   Abdominal Pain, Adult Many things can cause abdominal pain. Usually, abdominal pain is not caused by a disease and will improve without treatment. It can often be observed and treated at home. Your health care provider will do a physical exam and possibly order blood tests and X-rays to help determine the seriousness of your pain. However, in many cases, more time must pass before a clear cause of the pain can be found. Before that point, your health care provider may not know if you need more testing or further treatment. HOME CARE INSTRUCTIONS Monitor your abdominal pain for any changes. The following actions may help to alleviate any discomfort you are experiencing:  Only take over-the-counter or prescription medicines as directed by your health care provider.  Do not take laxatives unless directed to do so by your health care provider.  Try a clear liquid diet (broth, tea, or water) as directed by your health care provider. Slowly move to a bland diet as tolerated. SEEK MEDICAL CARE IF:  You have unexplained abdominal pain.  You have abdominal pain associated with nausea or diarrhea.  You have pain when you urinate or have a bowel movement.  You experience abdominal pain that wakes you in the night.  You have abdominal pain that is worsened or improved by eating food.  You have abdominal pain that is worsened  with eating fatty foods.  You have a fever. SEEK IMMEDIATE MEDICAL CARE IF:  Your pain does not go away within 2 hours.  You keep throwing up (vomiting).  Your pain is felt only in portions of the abdomen, such as the right side or the left lower portion of the abdomen.  You pass bloody or black tarry stools. MAKE SURE YOU:  Understand these instructions.  Will watch your condition.  Will get help right away if you are not doing well or get worse.   This information is not intended to replace advice given to you by your health care provider. Make sure you discuss any questions you have with your health care provider.   Document Released: 07/31/2005 Document Revised: 07/12/2015 Document Reviewed: 06/30/2013 Elsevier Interactive Patient Education 2016 [employer] Inc[employer]  Diarrhea Diarrhea is frequent loose and watery bowel movements. It can cause you to feel weak and dehydrated. Dehydration can cause you to become tired and thirsty, have a dry mouth, and have decreased urination that often is dark yellow. Diarrhea is a sign of another problem, most often an infection that will not last long. In most cases, diarrhea typically lasts 2-3 days. However, it can last longer if it is a sign of something more serious. It is important to treat your diarrhea as directed by your caregiver to lessen or prevent future episodes of diarrhea. CAUSES  Some common causes include:  Gastrointestinal infections caused by viruses, bacteria, or parasites.  Food poisoning or food allergies.  Certain medicines, such as antibiotics, chemotherapy, and  laxatives.  Artificial sweeteners and fructose.  Digestive disorders. HOME CARE INSTRUCTIONS  Ensure adequate fluid intake (hydration): Have 1 cup (8 oz) of fluid for each diarrhea episode. Avoid fluids that contain simple sugars or sports drinks, fruit juices, whole milk products, and sodas. Your urine should be clear or pale yellow if you are drinking enough  fluids. Hydrate with an oral rehydration solution that you can purchase at pharmacies, retail stores, and online. You can prepare an oral rehydration solution at home by mixing the following ingredients together:   - tsp table salt.   tsp baking soda.   tsp salt substitute containing potassium chloride.  1  tablespoons sugar.  1 L (34 oz) of water.  Certain foods and beverages may increase the speed at which food moves through the gastrointestinal (GI) tract. These foods and beverages should be avoided and include:  Caffeinated and alcoholic beverages.  High-fiber foods, such as raw fruits and vegetables, nuts, seeds, and whole grain breads and cereals.  Foods and beverages sweetened with sugar alcohols, such as xylitol, sorbitol, and mannitol.  Some foods may be well tolerated and may help thicken stool including:  Starchy foods, such as rice, toast, pasta, low-sugar cereal, oatmeal, grits, baked potatoes, crackers, and bagels.  Bananas.  Applesauce.  Add probiotic-rich foods to help increase healthy bacteria in the GI tract, such as yogurt and fermented milk products.  Wash your hands well after each diarrhea episode.  Only take over-the-counter or prescription medicines as directed by your caregiver.  Take a warm bath to relieve any burning or pain from frequent diarrhea episodes. SEEK IMMEDIATE MEDICAL CARE IF:   You are unable to keep fluids down.  You have persistent vomiting.  You have blood in your stool, or your stools are black and tarry.  You do not urinate in 6-8 hours, or there is only a small amount of very dark urine.  You have abdominal pain that increases or localizes.  You have weakness, dizziness, confusion, or light-headedness.  You have a severe headache.  Your diarrhea gets worse or does not get better.  You have a fever or persistent symptoms for more than 2-3 days.  You have a fever and your symptoms suddenly get worse. MAKE SURE YOU:     Understand these instructions.  Will watch your condition.  Will get help right away if you are not doing well or get worse.   This information is not intended to replace advice given to you by your health care provider. Make sure you discuss any questions you have with your health care provider.   Document Released: 10/11/2002 Document Revised: 11/11/2014 Document Reviewed: 06/28/2012 Elsevier Interactive Patient Education [employer]

## 2016-03-10 NOTE — ED Notes (Signed)
Dr. Nanavati at bedside 

## 2016-03-11 LAB — URINE CULTURE: Culture: 70000 — AB

## 2016-03-12 ENCOUNTER — Telehealth (HOSPITAL_BASED_OUTPATIENT_CLINIC_OR_DEPARTMENT_OTHER): Payer: Self-pay | Admitting: Emergency Medicine

## 2016-03-12 NOTE — Telephone Encounter (Signed)
Post ED Visit - Positive Culture Follow-up  Culture report reviewed by antimicrobial stewardship pharmacist:  []  Tara Hayes, Pharm.D. []  Tara Hayes, Pharm.D., BCPS []  Tara Hayes, Pharm.D. []  Tara Hayes, Pharm.D., BCPS []  Tara Hayes, [address] BCPS, AAHIVP []  Tara Hayes, Pharm.D., BCPS, AAHIVP [x]  Tara Hayes, Pharm.D. []  Tara Hayes, [address]  Positive urine culture Treated with cephalexin, organism sensitive to the same and no further patient follow-up is required at this time.  Tara Hayes, Tara Hayes 03/12/2016, 9:11 AM

## 2016-03-14 LAB — CULTURE, BLOOD (ROUTINE X 2)
CULTURE: NO GROWTH
Culture: NO GROWTH

## 2016-05-05 ENCOUNTER — Emergency Department (HOSPITAL_COMMUNITY)
Admission: EM | Admit: 2016-05-05 | Discharge: 2016-05-05 | Disposition: A | Discharge status: Home / Self Care | Payer: Medicaid Other | Attending: Emergency Medicine | Admitting: Emergency Medicine

## 2016-05-05 ENCOUNTER — Encounter (HOSPITAL_COMMUNITY): Payer: Self-pay | Admitting: *Deleted

## 2016-05-05 DIAGNOSIS — Y929 Unspecified place or not applicable: Secondary | ICD-10-CM | POA: Diagnosis not present

## 2016-05-05 DIAGNOSIS — Z79899 Other long term (current) drug therapy: Secondary | ICD-10-CM | POA: Insufficient documentation

## 2016-05-05 DIAGNOSIS — S61215A Laceration without foreign body of left ring finger without damage to nail, initial encounter: Secondary | ICD-10-CM | POA: Insufficient documentation

## 2016-05-05 DIAGNOSIS — Y999 Unspecified external cause status: Secondary | ICD-10-CM | POA: Diagnosis not present

## 2016-05-05 DIAGNOSIS — W260XXA Contact with knife, initial encounter: Secondary | ICD-10-CM | POA: Insufficient documentation

## 2016-05-05 DIAGNOSIS — T148XXA Other injury of unspecified body region, initial encounter: Secondary | ICD-10-CM

## 2016-05-05 DIAGNOSIS — Y9389 Activity, other specified: Secondary | ICD-10-CM | POA: Insufficient documentation

## 2016-05-05 DIAGNOSIS — F17200 Nicotine dependence, unspecified, uncomplicated: Secondary | ICD-10-CM | POA: Diagnosis not present

## 2016-05-05 MED ORDER — TETANUS-DIPHTH-ACELL PERTUSSIS 5-2.5-18.5 LF-MCG/0.5 IM SUSP: 0.5000 mL | Freq: Once | INTRAMUSCULAR | Status: DC

## 2016-05-05 MED ORDER — IBUPROFEN 400 MG PO TABS
800.0000 mg | ORAL_TABLET | Freq: Once | ORAL | Status: AC
  Administered 2016-05-05: 800 mg via ORAL
  Filled 2016-05-05: qty 2

## 2016-05-05 NOTE — ED Notes (Signed)
Pt reports while cutting up a cucumber she cut the tip of the LT ring finger. Pt reports she can not get the bleeding to stop.

## 2016-05-05 NOTE — ED Notes (Signed)
Declined W/C at D/C and was escorted to lobby by RN. 
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
## 2016-05-05 NOTE — Discharge Instructions (Signed)
Deep Skin Avulsion A deep skin avulsion is a type of open wound. It often results from a severe injury (trauma) that tears away all layers of the skin or an entire body part. The areas of the body that are most often affected by a deep skin avulsion include the face, lips, ears, nose, and fingers. A deep skin avulsion may make structures below the skin become visible. You may be able to see muscle, bone, nerves, and blood vessels. A deep skin avulsion can also damage important structures beneath the skin. These include tendons, ligaments, nerves, or blood vessels. CAUSES Injuries that often cause a deep skin avulsion include:  Being crushed.  Falling against a jagged surface.  Animal bites.  Gunshot wounds.  Severe burns.  Injuries that involve being dragged, such as bicycle or motorcycle accidents. SYMPTOMS Symptoms of a deep skin avulsion include:  Pain.  Numbness.  Swelling.  A misshapen body part.  Bleeding, which may be heavy.  Fluid leaking from the wound. DIAGNOSIS This condition may be diagnosed with a medical history and physical exam. You may also have X-rays done. TREATMENT The treatment that is chosen for a deep skin avulsion depends on how large and deep the wound is and where it is located. Treatment for all types of avulsions usually starts with:  Controlling the bleeding.  Washing out the wound with a germ-free (sterile) salt-water solution.  Removing dead tissue from the wound. A wound may be closed or left open to heal. This depends on the size and location of the wound and whether it is likely to become infected. Wounds are usually covered or closed if they expose blood vessels, nerves, bone, or cartilage.  Wounds that are small and clean may be closed with stitches (sutures).  Wounds that cannot be closed with sutures may be covered with a piece of skin (graft) or a skin flap. Skin may be taken from on or near the wound, from another part of the body,  or from a donor.  Wounds may be left open if they are hard to close or they may become infected. These wounds heal over time from the bottom up. You may also receive medicine. This may include:  Antibiotics.  A tetanus shot.  Rabies vaccine. HOME CARE INSTRUCTIONS Medicines  Take or apply over-the-counter and prescription medicines only as told by your health care provider.  If you were prescribed an antibiotic, take or apply it as told by your health care provider. Do not stop taking the antibiotic even if your condition improves.  You may get anti-itch medicine while your wound is healing. Use it only as told by your health care provider. Wound Care  There are many ways to close and cover a wound. For example, a wound can be covered with sutures, skin glue, or adhesive strips. Follow instructions from your health care provider about:  How to take care of your wound.  When and how you should change your bandage (dressing).  When you should remove your dressing.  Removing whatever was used to close your wound.  Keep the dressing dry as told by your health care provider. Do not take baths, swim, use a hot tub, or do anything that would put your wound underwater until your health care provider approves.  Clean the wound each day or as told by your health care provider.  Wash the wound with mild soap and water.  Rinse the wound with water to remove all soap.  Pat the wound   dry with a clean towel. Do not rub it.  Do not scratch or pick at the wound.  Check your wound every day for signs of infection. Watch for:  Redness, swelling, or pain.  Fluid, blood, or pus. General Instructions  Raise (elevate) the injured area above the level of your heart while you are sitting or lying down.  Keep all follow-up visits as told by your health care provider. This is important. SEEK MEDICAL CARE IF:  You received a tetanus shot and you have swelling, severe pain, redness, or  bleeding at the injection site.  You have a fever.  Your pain is not controlled with medicine.  You have increased redness, swelling, or pain at the site of your wound.  You have fluid, blood, or pus coming from your wound.  You notice a bad smell coming from your wound or your dressing.  A wound that was closed breaks open.  You notice something coming out of the wound, such as wood or glass.  You notice a change in the color of your skin near your wound.  You develop a new rash.  You need to change the dressing frequently due to fluid, blood, or pus draining from the wound. SEEK IMMEDIATE MEDICAL CARE IF:  Your pain suddenly increases and is severe.  You develop severe swelling around the wound.  You develop numbness around the wound.  You have nausea and vomiting that does not go away after 24 hours.  You feel light-headed, weak, or faint.  You develop chest pain.  You have trouble breathing.  Your wound is on your hand or foot and you cannot properly move a finger or toe.  The wound is on your hand or foot and you notice that your fingers or toes look pale or bluish.  You have a red streak going away from your wound.   This information is not intended to replace advice given to you by your health care provider. Make sure you discuss any questions you have with your health care provider.   Document Released: 12/17/2006 Document Revised: 03/07/2015 Document Reviewed: 10/26/2014 Elsevier Interactive Patient Education 2016 Else[employer] Inc[employer]  

## 2016-08-12 ENCOUNTER — Encounter (HOSPITAL_COMMUNITY): Payer: Self-pay

## 2016-08-12 ENCOUNTER — Emergency Department (HOSPITAL_COMMUNITY)
Admission: EM | Admit: 2016-08-12 | Discharge: 2016-08-12 | Disposition: A | Discharge status: Home / Self Care | Payer: Medicaid Other | Attending: Dermatology | Admitting: Dermatology

## 2016-08-12 DIAGNOSIS — M542 Cervicalgia: Secondary | ICD-10-CM | POA: Insufficient documentation

## 2016-08-12 DIAGNOSIS — Y939 Activity, unspecified: Secondary | ICD-10-CM | POA: Insufficient documentation

## 2016-08-12 DIAGNOSIS — F17200 Nicotine dependence, unspecified, uncomplicated: Secondary | ICD-10-CM | POA: Insufficient documentation

## 2016-08-12 DIAGNOSIS — Y999 Unspecified external cause status: Secondary | ICD-10-CM | POA: Insufficient documentation

## 2016-08-12 DIAGNOSIS — W2201XA Walked into wall, initial encounter: Secondary | ICD-10-CM | POA: Insufficient documentation

## 2016-08-12 DIAGNOSIS — Y929 Unspecified place or not applicable: Secondary | ICD-10-CM | POA: Insufficient documentation

## 2016-08-12 DIAGNOSIS — R0602 Shortness of breath: Secondary | ICD-10-CM | POA: Diagnosis not present

## 2016-08-12 DIAGNOSIS — Z5321 Procedure and treatment not carried out due to patient leaving prior to being seen by health care provider: Secondary | ICD-10-CM | POA: Insufficient documentation
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
## 2016-08-12 NOTE — ED Notes (Signed)
Pt refusing blood work. Pt states "i just want to go home. I'll come back if it gets worse." RN encouraged pt to be seen by MD. Pt continues to request to leave. Pt a/o x 4, ambulatory, in NAD.

## 2017-05-15 ENCOUNTER — Encounter (HOSPITAL_COMMUNITY): Payer: Self-pay

## 2017-05-15 ENCOUNTER — Emergency Department (HOSPITAL_COMMUNITY)
Admission: EM | Admit: 2017-05-15 | Discharge: 2017-05-15 | Disposition: A | Discharge status: Home / Self Care | Payer: Medicaid Other | Attending: Emergency Medicine | Admitting: Emergency Medicine

## 2017-05-15 DIAGNOSIS — R112 Nausea with vomiting, unspecified: Secondary | ICD-10-CM | POA: Diagnosis not present

## 2017-05-15 DIAGNOSIS — Z5321 Procedure and treatment not carried out due to patient leaving prior to being seen by health care provider: Secondary | ICD-10-CM | POA: Diagnosis not present

## 2017-05-15 HISTORY — DX: Post-traumatic stress disorder, unspecified: F43.10

## 2017-05-15 LAB — POC URINE PREG, ED: Preg Test, Ur: NEGATIVE

## 2017-05-15 NOTE — ED Notes (Signed)
Pt states she is going to [facility] instead bc they have 15 min wait. Apologized for delay and encouraged her to stay. Still wants to leave.

## 2017-05-15 NOTE — ED Triage Notes (Signed)
Pt states she has been vomiting x 1 hour. She states "i think my body is shutting down. I have been working 7 days a week on no sleep and I am so exhausted." Pt reports taking zofran pta. With some relief, but is still nauseated.

## 2017-05-16 LAB — URINALYSIS, ROUTINE W REFLEX MICROSCOPIC
Bilirubin Urine: NEGATIVE
GLUCOSE, UA: NEGATIVE mg/dL
HGB URINE DIPSTICK: NEGATIVE
Ketones, ur: NEGATIVE mg/dL
NITRITE: POSITIVE — AB
PH: 6 (ref 5.0–8.0)
PROTEIN: NEGATIVE mg/dL
SPECIFIC GRAVITY, URINE: 1.015 (ref 1.005–1.030)

## 2017-05-16 LAB — COMPREHENSIVE METABOLIC PANEL
ALK PHOS: 40 U/L (ref 38–126)
ALT: 12 U/L — AB (ref 14–54)
ANION GAP: 6 (ref 5–15)
AST: 16 U/L (ref 15–41)
Albumin: 4 g/dL (ref 3.5–5.0)
BILIRUBIN TOTAL: 0.4 mg/dL (ref 0.3–1.2)
BUN: 10 mg/dL (ref 6–20)
CALCIUM: 9.2 mg/dL (ref 8.9–10.3)
CO2: 28 mmol/L (ref 22–32)
CREATININE: 0.74 mg/dL (ref 0.44–1.00)
Chloride: 105 mmol/L (ref 101–111)
GFR calc non Af Amer: >60 mL/min (ref >60)
GLUCOSE: 120 mg/dL — AB (ref 65–99)
Potassium: 3.8 mmol/L (ref 3.5–5.1)
Sodium: 139 mmol/L (ref 135–145)
TOTAL PROTEIN: 6.8 g/dL (ref 6.5–8.1)

## 2017-05-16 LAB — CBC
HCT: 39 % (ref 36.0–46.0)
HEMOGLOBIN: 12.8 g/dL (ref 12.0–15.0)
MCH: 31.4 pg (ref 26.0–34.0)
MCHC: 32.8 g/dL (ref 30.0–36.0)
MCV: 95.6 fL (ref 78.0–100.0)
PLATELETS: 259 10*3/uL (ref 150–400)
RBC: 4.08 MIL/uL (ref 3.87–5.11)
RDW: 14.3 % (ref 11.5–15.5)
WBC: 9.6 10*3/uL (ref 4.0–10.5)

## 2017-05-16 LAB — LIPASE, BLOOD: Lipase: 24 U/L (ref 11–51)

## 2017-05-27 ENCOUNTER — Emergency Department (HOSPITAL_COMMUNITY)
Admission: EM | Admit: 2017-05-27 | Discharge: 2017-05-27 | Discharge status: Against Medical Advice | Payer: Medicaid Other

## 2017-05-27 NOTE — ED Notes (Signed)
Pt states, "I am not staying. Thanks anyway." pt walked away before nurse could encourage pt to remain for eval, pt will be removed from the system as LWBS before triage

## 2017-07-01 ENCOUNTER — Emergency Department (HOSPITAL_COMMUNITY)
Admission: EM | Admit: 2017-07-01 | Discharge: 2017-07-01 | Discharge status: Against Medical Advice | Payer: Medicaid Other

## 2017-07-01 NOTE — ED Notes (Signed)
Pt called to be taken to triage room x 2 with no answer

## 2017-07-01 NOTE — ED Notes (Signed)
Pt called to be taken to triage room with no answer x 1

## 2017-08-02 ENCOUNTER — Emergency Department (HOSPITAL_COMMUNITY): Payer: Medicaid Other

## 2017-08-02 ENCOUNTER — Encounter (HOSPITAL_COMMUNITY): Payer: Self-pay | Admitting: Emergency Medicine

## 2017-08-02 ENCOUNTER — Emergency Department (HOSPITAL_COMMUNITY)
Admission: EM | Admit: 2017-08-02 | Discharge: 2017-08-03 | Disposition: A | Discharge status: Home / Self Care | Payer: Medicaid Other | Attending: Emergency Medicine | Admitting: Emergency Medicine

## 2017-08-02 DIAGNOSIS — S43004A Unspecified dislocation of right shoulder joint, initial encounter: Secondary | ICD-10-CM | POA: Diagnosis not present

## 2017-08-02 DIAGNOSIS — Y9289 Other specified places as the place of occurrence of the external cause: Secondary | ICD-10-CM | POA: Diagnosis not present

## 2017-08-02 DIAGNOSIS — Y9389 Activity, other specified: Secondary | ICD-10-CM | POA: Insufficient documentation

## 2017-08-02 DIAGNOSIS — S4991XA Unspecified injury of right shoulder and upper arm, initial encounter: Secondary | ICD-10-CM

## 2017-08-02 DIAGNOSIS — F17210 Nicotine dependence, cigarettes, uncomplicated: Secondary | ICD-10-CM | POA: Diagnosis not present

## 2017-08-02 DIAGNOSIS — Z91040 Latex allergy status: Secondary | ICD-10-CM | POA: Diagnosis not present

## 2017-08-02 DIAGNOSIS — Y990 Civilian activity done for income or pay: Secondary | ICD-10-CM | POA: Diagnosis not present

## 2017-08-02 DIAGNOSIS — Z79899 Other long term (current) drug therapy: Secondary | ICD-10-CM | POA: Diagnosis not present

## 2017-08-02 MED ORDER — NAPROXEN 500 MG PO TABS: ORAL_TABLET | ORAL | 0 refills | Status: AC

## 2017-08-02 MED ORDER — KETOROLAC TROMETHAMINE 30 MG/ML IJ SOLN: 30.0000 mg | Freq: Once | INTRAMUSCULAR | Status: DC

## 2017-08-02 NOTE — Discharge Instructions (Signed)
Wear the shoulder imobilizer for the next 2 weeks. DO NOT RAISE YOUR RIGHT ARM OVER YOUR SHOULDER/HEAD OR IT COULD POP OUT AGAIN! Take the naproxen for pain. Use ice packs for comfort. Call Dr William Hamburger office on Monday to get a follow up appointment in about 2 weeks.

## 2017-08-02 NOTE — ED Notes (Signed)
Patient transported to X-ray 
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
## 2017-08-02 NOTE — ED Notes (Signed)
Rounded on pt, pt states she feels better, states she doesn't want any more pain medication and just wants to go home. Requesting a sling at time of discharge and requests that MD not prescribe her anything stronger than ibuprofen.

## 2017-08-03 NOTE — ED Notes (Signed)
Pt understood dc material. NAD noted. Scripts given  

## 2023-08-26 ENCOUNTER — Other Ambulatory Visit (HOSPITAL_BASED_OUTPATIENT_CLINIC_OR_DEPARTMENT_OTHER): Payer: Self-pay

## 2023-08-26 ENCOUNTER — Emergency Department (HOSPITAL_BASED_OUTPATIENT_CLINIC_OR_DEPARTMENT_OTHER)
Admission: EM | Admit: 2023-08-26 | Discharge: 2023-08-26 | Disposition: A | Discharge status: Home / Self Care | Payer: Medicaid Other | Attending: Emergency Medicine | Admitting: Emergency Medicine

## 2023-08-26 ENCOUNTER — Encounter (HOSPITAL_BASED_OUTPATIENT_CLINIC_OR_DEPARTMENT_OTHER): Payer: Self-pay | Admitting: Pediatrics

## 2023-08-26 ENCOUNTER — Other Ambulatory Visit: Payer: Self-pay

## 2023-08-26 DIAGNOSIS — X58XXXA Exposure to other specified factors, initial encounter: Secondary | ICD-10-CM | POA: Insufficient documentation

## 2023-08-26 DIAGNOSIS — Z91040 Latex allergy status: Secondary | ICD-10-CM | POA: Insufficient documentation

## 2023-08-26 DIAGNOSIS — M546 Pain in thoracic spine: Secondary | ICD-10-CM | POA: Insufficient documentation

## 2023-08-26 DIAGNOSIS — S46811A Strain of other muscles, fascia and tendons at shoulder and upper arm level, right arm, initial encounter: Secondary | ICD-10-CM | POA: Insufficient documentation

## 2023-08-26 MED ORDER — METHYLPREDNISOLONE 4 MG PO TBPK
ORAL_TABLET | ORAL | 0 refills | Status: AC
  Filled 2023-08-26: qty 21, 6d supply, fill #0

## 2023-08-26 MED ORDER — KETOROLAC TROMETHAMINE 15 MG/ML IJ SOLN
15.0000 mg | Freq: Once | INTRAMUSCULAR | Status: AC
  Administered 2023-08-26: 15 mg via INTRAMUSCULAR
  Filled 2023-08-26: qty 1

## 2023-08-26 NOTE — ED Provider Notes (Signed)
[facility] Provider Note   CSN: [id number] Arrival date & time: 08/26/23  8315     History  Chief Complaint  Patient presents with   Shoulder Pain    Tara Hayes is a 42 y.o. female.  42 yo F with a chief complaints of right upper back pain.  This has been going on for the better part of 3 weeks.  She said she woke up after sleeping on someone's couch and it was quite sore.  She denies any obvious injury.  She seen a chiropractor for this.  Has had some manipulation without significant improvement.  Got a deep tissue massage from a friend also without improvement.  Has tried NSAIDs off and on has tried muscle relaxants off and on has tried narcotics off and on also without improvement.   Shoulder Pain      Home Medications Prior to Admission medications   Medication Sig Start Date End Date Taking? Authorizing Provider  methylPREDNISolone (MEDROL DOSEPAK) 4 MG TBPK tablet Day 1: 8mg  before breakfast, 4 mg after lunch, 4 mg after supper, and 8 mg at bedtime  Day 2: 4 mg before breakfast, 4 mg after lunch, 4 mg  after supper, and 8 mg  at bedtime Day 3:  4 mg  before breakfast, 4 mg  after lunch, 4 mg after supper, and 4 mg  at bedtime Day 4: 4 mg  before breakfast, 4 mg  after lunch, and 4 mg at bedtime Day 5: 4 mg  before breakfast and 4 mg at bedtime Day 6: 4 mg  before breakfast 08/26/23  Yes Melene Plan, DO  amoxicillin-clavulanate (AUGMENTIN) 875-125 MG per tablet Take 1 tablet by mouth 2 (two) times daily. One po bid x 7 days Patient not taking: Reported on 03/09/2016 01/20/15   Arby Barrette, MD  ciprofloxacin (CIPRO) 500 MG tablet Take 1 tablet (500 mg total) by mouth 2 (two) times daily. 03/10/16   Derwood Kaplan, MD  fluconazole (DIFLUCAN) 150 MG tablet Take 1 tablet (150 mg total) by mouth daily. Patient not taking: Reported on 03/09/2016 12/10/14   Santiago Glad, PA-C  ibuprofen (ADVIL,MOTRIN) 600 MG tablet Take 1 tablet  (600 mg total) by mouth every 6 (six) hours as needed. Patient not taking: Reported on 12/10/2014 03/01/14   Ward, Layla Maw, DO  ibuprofen (ADVIL,MOTRIN) 800 MG tablet Take 1 tablet (800 mg total) by mouth 3 (three) times daily. Patient not taking: Reported on 03/09/2016 01/20/15   Arby Barrette, MD  lisdexamfetamine (VYVANSE) 40 MG capsule Take 40 mg by mouth daily.    [provider]  loperamide (IMODIUM) 2 MG capsule Take 2 capsules (4 mg total) by mouth at bedtime as needed for diarrhea or loose stools. 03/10/16   Derwood Kaplan, MD  metroNIDAZOLE (FLAGYL) 500 MG tablet Take 1 tablet (500 mg total) by mouth 2 (two) times daily. 03/10/16   Derwood Kaplan, MD  naproxen (NAPROSYN) 500 MG tablet Take 1 po BID with food prn pain 08/02/17   Devoria Albe, MD  nitrofurantoin, macrocrystal-monohydrate, (MACROBID) 100 MG capsule Take 1 capsule (100 mg total) by mouth 2 (two) times daily. Patient not taking: Reported on 03/09/2016 12/10/14   Santiago Glad, PA-C  ondansetron (ZOFRAN ODT) 4 MG disintegrating tablet Take 1 tablet (4 mg total) by mouth every 8 (eight) hours as needed for nausea or vomiting. Patient not taking: Reported on 03/09/2016 01/20/15   Arby Barrette, MD  ondansetron (ZOFRAN) 4 MG tablet  Take 1 tablet (4 mg total) by mouth every 6 (six) hours. Patient not taking: Reported on 03/09/2016 12/10/14   Santiago Glad, PA-C  ondansetron (ZOFRAN) 8 MG tablet Take 1 tablet (8 mg total) by mouth every 8 (eight) hours as needed for nausea or vomiting. 03/09/16   Street, [place], PA-C  phenazopyridine (PYRIDIUM) 200 MG tablet Take 1 tablet (200 mg total) by mouth 3 (three) times daily. Patient not taking: Reported on 03/09/2016 01/20/15   Arby Barrette, MD  promethazine (PHENERGAN) 25 MG tablet Take 1 tablet (25 mg total) by mouth every 6 (six) hours as needed for nausea or vomiting. 03/09/16   Street, [place], PA-C      Allergies    Latex    Review of Systems   Review of Systems  Physical  Exam Updated Vital Signs BP (!) 105/51 (BP Location: Left Arm)   Pulse 77   Temp 98.2 F (36.8 C) (Oral)   Resp 18   Ht 5\' 7"  (1.702 m)   Wt 89.8 kg   LMP 08/26/2023   SpO2 98%   BMI 31.01 kg/m  Physical Exam Vitals and nursing note reviewed.  Constitutional:      General: She is not in acute distress.    Appearance: She is well-developed. She is not diaphoretic.  HENT:     Head: Normocephalic and atraumatic.  Eyes:     Pupils: Pupils are equal, round, and reactive to light.  Cardiovascular:     Rate and Rhythm: Normal rate and regular rhythm.     Heart sounds: No murmur heard.    No friction rub. No gallop.  Pulmonary:     Effort: Pulmonary effort is normal.     Breath sounds: No wheezing or rales.  Abdominal:     General: There is no distension.     Palpations: Abdomen is soft.     Tenderness: There is no abdominal tenderness.  Musculoskeletal:        General: No tenderness.     Cervical back: Normal range of motion and neck supple.     Comments: Patient has a focal point of tenderness just underneath the right scapula.  She has some diffuse pain about the trapezius and extends to the deltoid.  No midline spinal tenderness step-offs or deformities.  Pulse motor and sensation intact distally.  Skin:    General: Skin is warm and dry.  Neurological:     Mental Status: She is alert and oriented to person, place, and time.  Psychiatric:        Behavior: Behavior normal.     ED Results / Procedures / Treatments   Labs (all labs ordered are listed, but only abnormal results are displayed) Labs Reviewed - No data to display  EKG None  Radiology No results found.  Procedures Procedures    Medications Ordered in ED Medications  ketorolac (TORADOL) 15 MG/ML injection 15 mg (15 mg Intramuscular Given 08/26/23 1914)    ED Course/ Medical Decision Making/ A&P                                 Medical Decision Making Risk Prescription drug management.   42  yo F with a chief complaint of right upper back pain.  Clinically the patient has a trigger point.  Reproduces her discomfort.  Discussed different modalities of treatment at home.  Patient wants to trial steroids as that was suggested by her  chiropractor.  I encouraged her to follow-up with a PCP.  She does not have 1.  Will refer her to the sports medicine clinic.  9:43 AM:  I have discussed the diagnosis/risks/treatment options with the patient.  Evaluation and diagnostic testing in the emergency department does not suggest an emergent condition requiring admission or immediate intervention beyond what has been performed at this time.  They will follow up with PCP. We also discussed returning to the ED immediately if new or worsening sx occur. We discussed the sx which are most concerning (e.g., sudden worsening pain, fever, inability to tolerate by mouth) that necessitate immediate return. Medications administered to the patient during their visit and any new prescriptions provided to the patient are listed below.  Medications given during this visit Medications  ketorolac (TORADOL) 15 MG/ML injection 15 mg (15 mg Intramuscular Given 08/26/23 1610)     The patient appears reasonably screen and/or stabilized for discharge and I doubt any other medical condition or other [facility] requiring further screening, evaluation, or treatment in the ED at this time prior to discharge.          Final Clinical Impression(s) / ED Diagnoses Final diagnoses:  Strain of right trapezius muscle, initial encounter    Rx / DC Orders ED Discharge Orders          Ordered    methylPREDNISolone (MEDROL DOSEPAK) 4 MG TBPK tablet        08/26/23 0936              Melene Plan, DO 08/26/23 [phone]

## 2023-08-26 NOTE — Discharge Instructions (Addendum)
Take the steroids as prescribed.  Also take tylenol 1000mg (2 extra strength) four times a day.   I have given you a sling for comfort.  You do need to take your arm out of the sling at least 4 times a day and perform range of motion exercises.  I have given you information to follow-up with the sports medicine clinic in our system.  Please call them today to try to set up an appointment.

## 2023-08-26 NOTE — ED Triage Notes (Signed)
C/O right sided shoulder pain started about 3 weeks ago and radiating down. States tried heat / ice therapy, massages, and seen chiropractor with not much relief.

## 2023-08-27 ENCOUNTER — Ambulatory Visit: Payer: Self-pay | Admitting: *Deleted

## 2023-08-27 NOTE — Telephone Encounter (Signed)
  Chief Complaint: A lot of shoulder pain.   Seen in ED 08/26/2023 by Dr. Melene Plan, DO.  Prednisone prescribed but told she could not take ibuprofen with the prednisone.    Symptoms: She is requesting something for pain.   She has tried Tylenol, ice pack, heating pad, a hot shower without relief.   "I hardly slept last night due to the pain" Frequency: Since being seen yesterday in ED Pertinent Negatives: Patient denies anything giving her relief. Disposition: [] ED /[] Urgent Care (no appt availability in office) / [] Appointment(In office/virtual)/ []  [facility]/ [] Home Care/ [] Refused Recommended Disposition /[]  Mobile Bus/ [x]  Follow-up with PCP Additional Notes: I'm sending a secure chat message to Dr. Melene Plan, DO with her request for pain medication.   Pt. Can be reached at [phone].    If agreeable to sending in rx please send to [facility] in [place].     Dr. Melene Plan, DO responded to my secure chat.  She needs to follow up with sports medicine as was discussed during her visit.  I called her back and gave her the message from Dr. Adela Lank to follow up with the sports medicine dr.   I let her know he put the information in the discharge papers.   She mentioned she can't afford to go to the sports medicine dr right now   Doesn't have insurance and is in between jobs right now.    She mentioned she would return to the ED then.   I let her know that would be the next best thing to do.   She was agreeable to this and thanked me very much for my time and help.

## 2023-08-27 NOTE — Telephone Encounter (Signed)
Reason for Disposition  [1] Caller has URGENT medicine question about med that PCP or specialist prescribed AND [2] triager unable to answer question    Pt. Seen 08/26/2023 in ED for shoulder pain.    Requesting something for pain since can't take ibuprofen with the Prednisone she was prescribed.   She's in a lot of pain.   "I have a pinched nerve".  Answer Assessment - Initial Assessment Questions 1. NAME of MEDICINE: "What medicine(s) are you calling about?"     Prednisone   2. QUESTION: "What is your question?" (e.g., double dose of medicine, side effect)     I'm still in a lot of pain in my shoulder.    I was seen yesterday in the ED.   I can't take the ibuprofen with the prednisone.   They gave me a shot in upper arm not in the joint. I don't know what I can do for the pain.    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  So I don't have a PCP.      I'm in so much pain.    3. PRESCRIBER: "Who prescribed the medicine?" Reason: if prescribed by specialist, call should be referred to that group.     ED doctor. 4. SYMPTOMS: "Do you have any symptoms?" If Yes, ask: "What symptoms are you having?"  "How bad are the symptoms (e.g., mild, moderate, severe)     What should I do for the pain since I can't take ibuprofen.    Nothing is helping.   5. PREGNANCY:  "Is there any chance that you are pregnant?" "When was your last menstrual period?"     Not asked I went to a chiropractor and he gave me stretches to do.   I've been dealing with this for 2 weeks.   I need to get back to work. I let her know I would send Dr. Melene Plan, DO the doctor she saw in the ED yesterday a message and see if he can [facility] her [facility] [place].   I let her know either I or the Dr. Adela Lank would call her back.   She was agreeable to this plan.  Protocols used: Medication Question Call-A-AH
